# Patient Record
Sex: Female | Born: 1980 | Race: Black or African American | Hispanic: No | Marital: Married | Smoking: Never smoker
Health system: Southern US, Community
[De-identification: ages and names within clinical notes are randomized; demographics above are authoritative.]

## PROBLEM LIST (undated history)

## (undated) DIAGNOSIS — D649 Anemia, unspecified: Secondary | ICD-10-CM

## (undated) DIAGNOSIS — F419 Anxiety disorder, unspecified: Secondary | ICD-10-CM

## (undated) DIAGNOSIS — D219 Benign neoplasm of connective and other soft tissue, unspecified: Secondary | ICD-10-CM

## (undated) DIAGNOSIS — R519 Headache, unspecified: Secondary | ICD-10-CM

## (undated) HISTORY — DX: Headache, unspecified: R51.9

## (undated) HISTORY — DX: Benign neoplasm of connective and other soft tissue, unspecified: D21.9

## (undated) HISTORY — DX: Anxiety disorder, unspecified: F41.9

## (undated) HISTORY — DX: Anemia, unspecified: D64.9

---

## 2004-11-13 ENCOUNTER — Emergency Department (HOSPITAL_COMMUNITY): Admission: EM | Admit: 2004-11-13 | Discharge: 2004-11-13 | Payer: Self-pay

## 2005-01-25 ENCOUNTER — Emergency Department (HOSPITAL_COMMUNITY): Admission: EM | Admit: 2005-01-25 | Discharge: 2005-01-25 | Payer: Self-pay | Admitting: Emergency Medicine

## 2005-03-05 ENCOUNTER — Emergency Department: Payer: Self-pay | Admitting: Emergency Medicine

## 2005-03-06 ENCOUNTER — Emergency Department: Payer: Self-pay | Admitting: Emergency Medicine

## 2005-10-21 ENCOUNTER — Emergency Department: Payer: Self-pay | Admitting: General Practice

## 2007-04-30 ENCOUNTER — Emergency Department: Payer: Self-pay | Admitting: Emergency Medicine

## 2008-03-13 ENCOUNTER — Emergency Department: Payer: Self-pay | Admitting: Emergency Medicine

## 2008-08-22 ENCOUNTER — Observation Stay: Payer: Self-pay | Admitting: Unknown Physician Specialty

## 2008-08-26 ENCOUNTER — Encounter: Payer: Self-pay | Admitting: Maternal & Fetal Medicine

## 2008-09-30 ENCOUNTER — Encounter: Payer: Self-pay | Admitting: Maternal & Fetal Medicine

## 2008-10-04 ENCOUNTER — Observation Stay: Payer: Self-pay | Admitting: Obstetrics and Gynecology

## 2008-10-18 ENCOUNTER — Inpatient Hospital Stay: Payer: Self-pay

## 2008-12-10 IMAGING — US US OB < 14 WEEKS - US OB TV
1 series · 17 of 28 positions shown · non-contrast
Comparison: none

REASON FOR EXAM: Pelvic pain, pregnant, history of  bilateral tubal
ligation
COMMENTS:

[Series 1: us ob < 14 weeks - us ob tv · 17 of 30 slices shown]
[im 1/30]
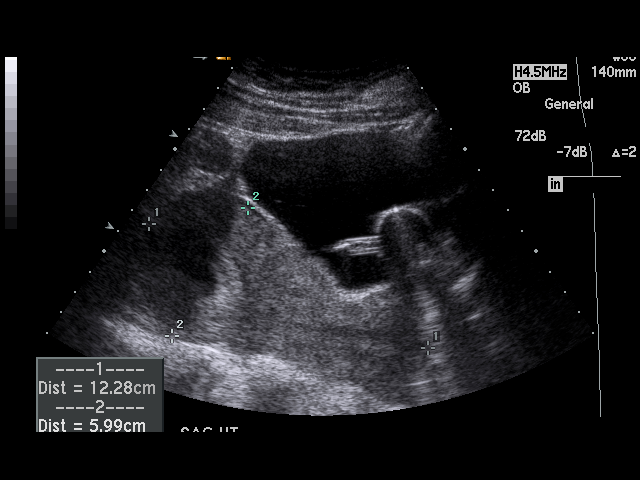
[im 3/30]
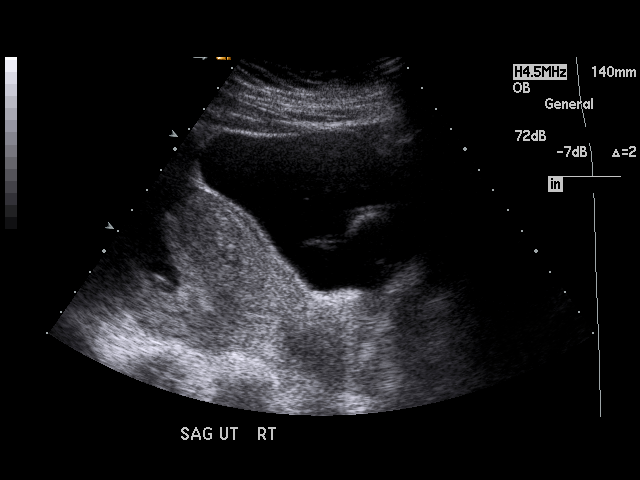
[im 5/30]
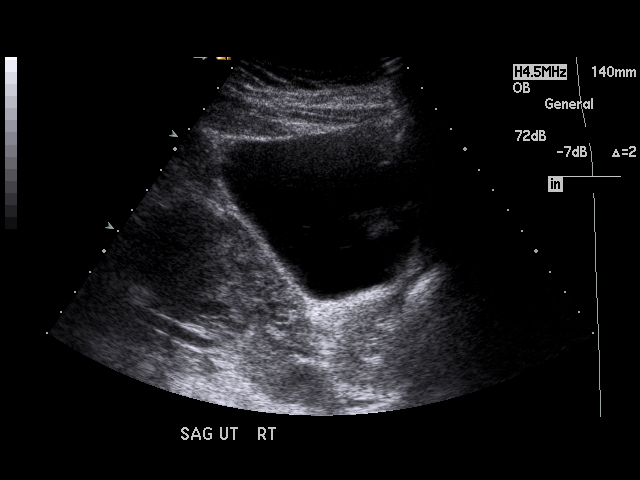
[im 6/30]
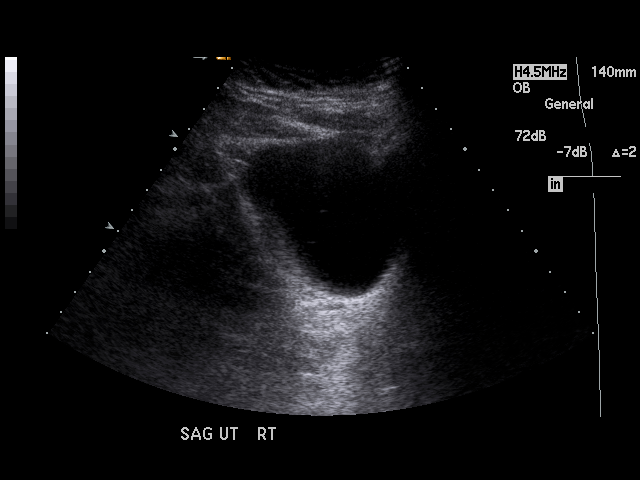
[im 8/30]
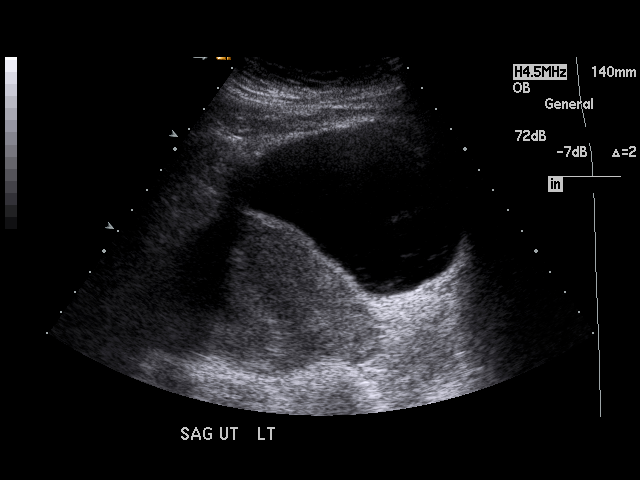
[im 10/30]
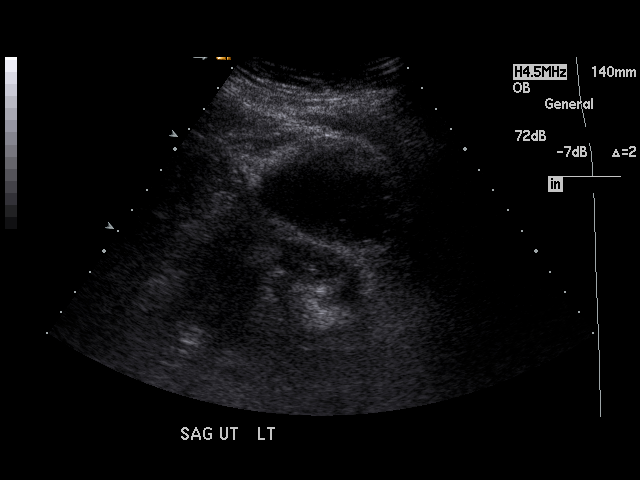
[im 11/30]
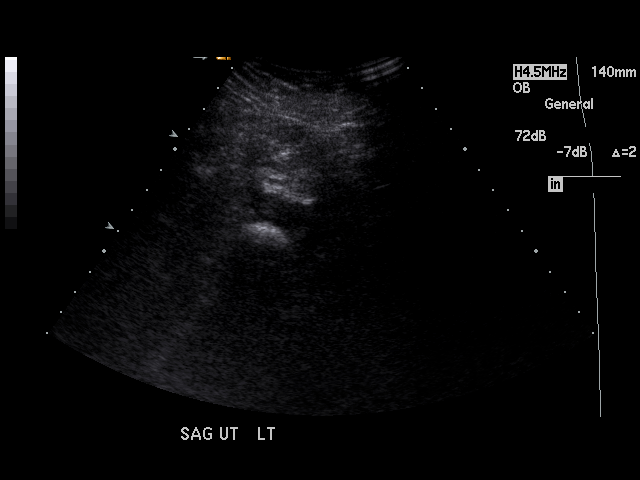
[im 13/30]
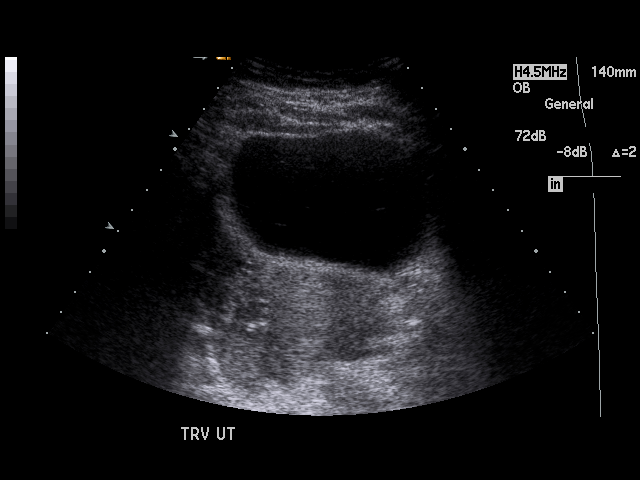
[im 16/30]
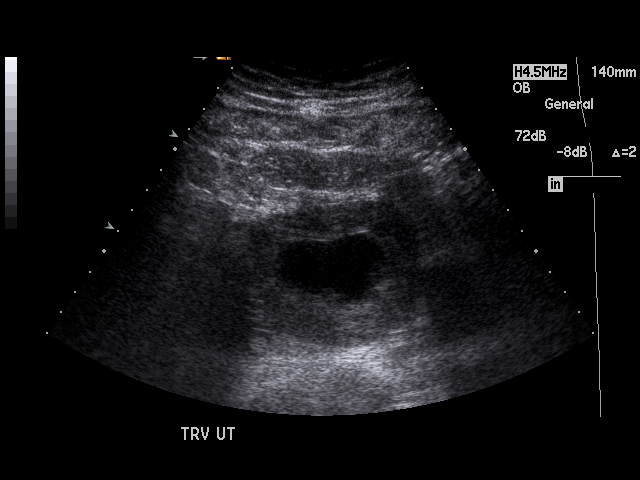
[im 17/30]
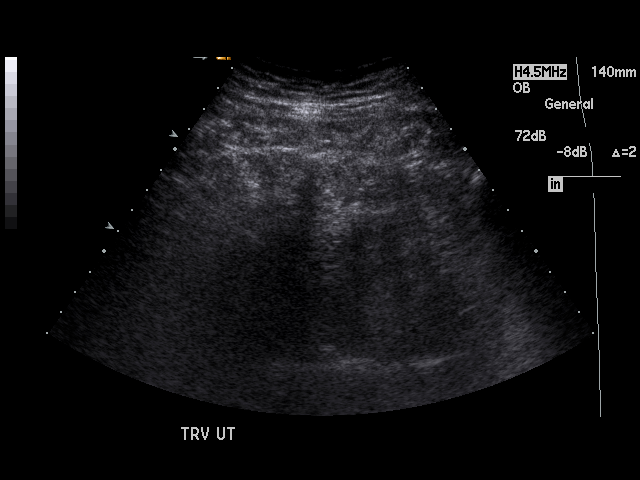
[im 19/30]
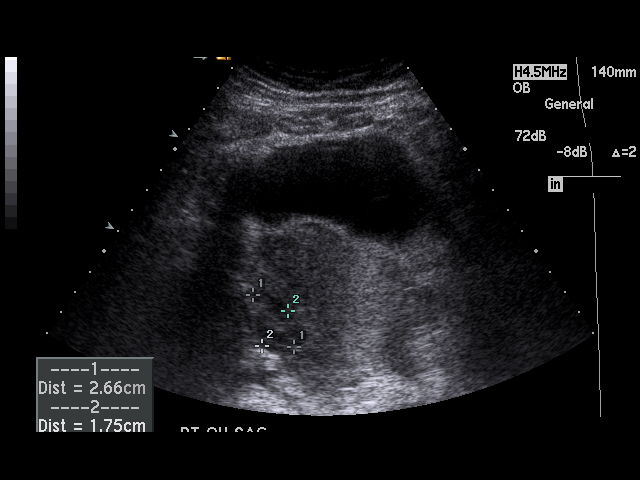
[im 20/30]
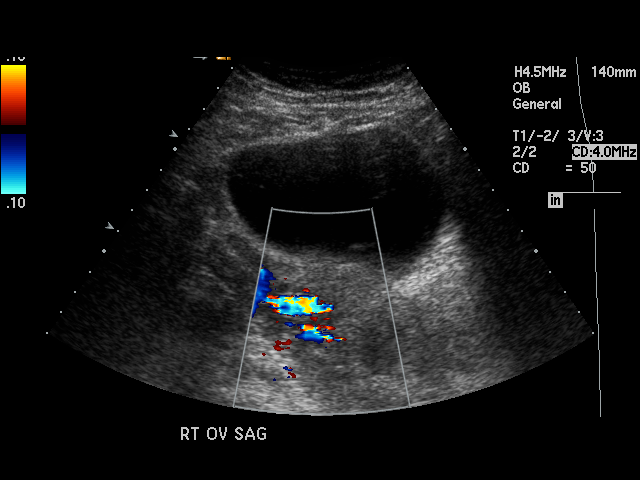
[im 22/30]
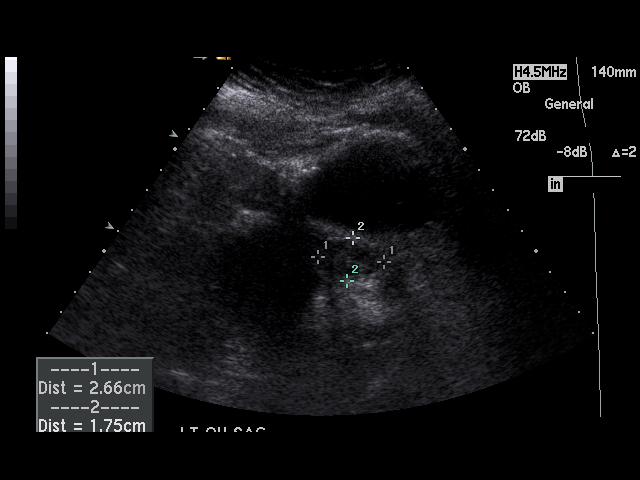
[im 24/30]
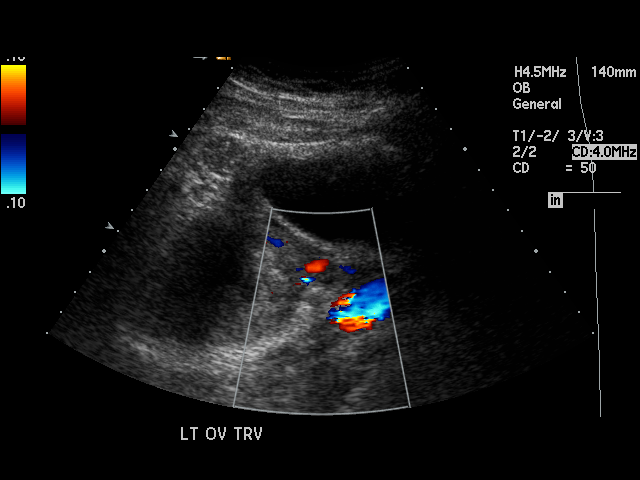
[im 25/30]
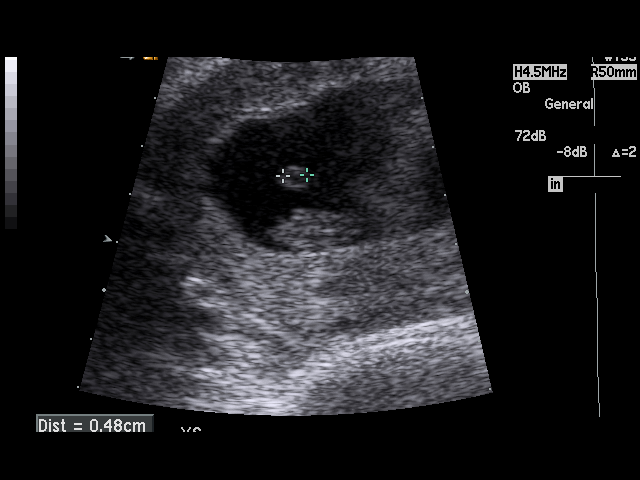
[im 27/30]
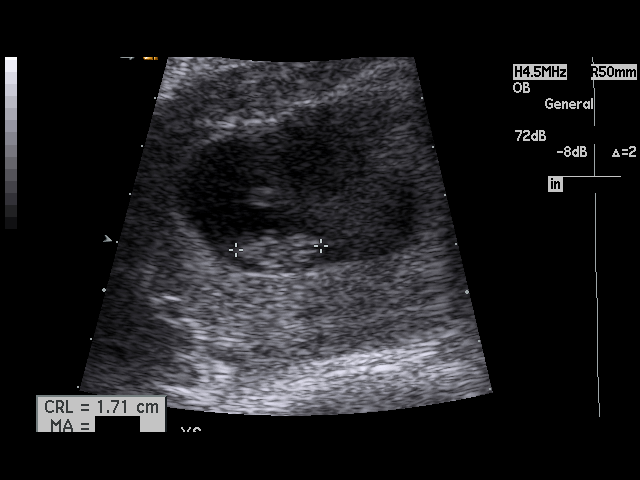
[im 30/30]
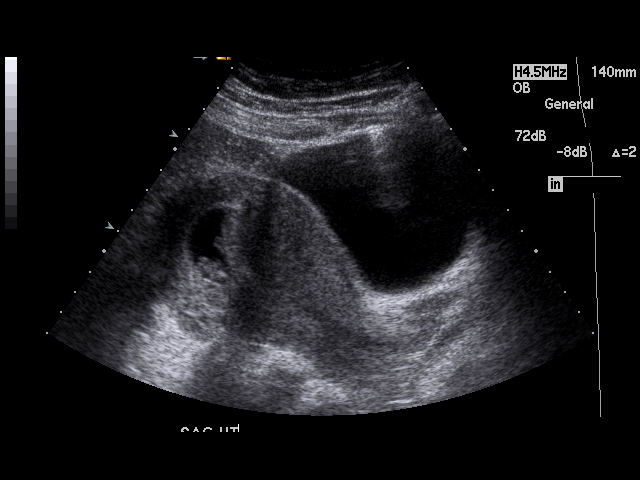

[17 of 28 positions shown; findings below may reference images not displayed]

PROCEDURE:     US  - US OB LESS THAN 14 WEEKS  - March 13, 2008  [DATE]

RESULT:     Emergent sonographic evaluation of the pelvis is performed
utilizing an early OB protocol. The study demonstrates what appears to be an
intrauterine gestational sac. There is a fetal pole with a crown-rump length
of 1.71 cm,  corresponding to an 8-week, one-day intrauterine gestation with
an estimated delivery date of 10/22/2008 sonographically. A fetal heart rate
of 160 beats per minute is present. The ovaries appear to be grossly normal.
There is no free fluid or adnexal mass. There is urine in the bladder.
IMPRESSION: Early viable intrauterine gestation as described.

## 2009-07-03 ENCOUNTER — Emergency Department (HOSPITAL_COMMUNITY): Admission: EM | Admit: 2009-07-03 | Discharge: 2009-07-03 | Payer: Self-pay | Admitting: Family Medicine

## 2013-07-28 ENCOUNTER — Emergency Department (HOSPITAL_COMMUNITY)
Admission: EM | Admit: 2013-07-28 | Discharge: 2013-07-28 | Disposition: A | Discharge status: Home / Self Care | Payer: Self-pay | Attending: Emergency Medicine | Admitting: Emergency Medicine

## 2013-07-28 ENCOUNTER — Encounter (HOSPITAL_COMMUNITY): Payer: Self-pay | Admitting: *Deleted

## 2013-07-28 DIAGNOSIS — Z113 Encounter for screening for infections with a predominantly sexual mode of transmission: Secondary | ICD-10-CM | POA: Insufficient documentation

## 2013-07-28 DIAGNOSIS — Z8619 Personal history of other infectious and parasitic diseases: Secondary | ICD-10-CM | POA: Insufficient documentation

## 2013-07-28 DIAGNOSIS — Z202 Contact with and (suspected) exposure to infections with a predominantly sexual mode of transmission: Secondary | ICD-10-CM

## 2013-07-28 DIAGNOSIS — R109 Unspecified abdominal pain: Secondary | ICD-10-CM | POA: Insufficient documentation

## 2013-07-28 DIAGNOSIS — N898 Other specified noninflammatory disorders of vagina: Secondary | ICD-10-CM | POA: Insufficient documentation

## 2013-07-28 LAB — URINALYSIS, ROUTINE W REFLEX MICROSCOPIC
Hgb urine dipstick: NEGATIVE
Ketones, ur: 15 mg/dL — AB
Nitrite: NEGATIVE
Protein, ur: NEGATIVE mg/dL
Specific Gravity, Urine: 1.034 — ABNORMAL HIGH (ref 1.005–1.030)
Urobilinogen, UA: 2 mg/dL — ABNORMAL HIGH (ref 0.0–1.0)

## 2013-07-28 LAB — WET PREP, GENITAL
Trich, Wet Prep: NONE SEEN
Yeast Wet Prep HPF POC: NONE SEEN

## 2013-07-28 MED ORDER — AZITHROMYCIN 1 G PO PACK
1.0000 g | PACK | Freq: Once | ORAL | Status: AC
  Administered 2013-07-28: 1 g via ORAL
  Filled 2013-07-28: qty 1

## 2013-07-28 MED ORDER — LIDOCAINE HCL (PF) 1 % IJ SOLN
INTRAMUSCULAR | Status: AC
  Administered 2013-07-28: 2.3 mL
  Filled 2013-07-28: qty 5

## 2013-07-28 MED ORDER — CEFTRIAXONE SODIUM 250 MG IJ SOLR
250.0000 mg | Freq: Once | INTRAMUSCULAR | Status: AC
  Administered 2013-07-28: 250 mg via INTRAMUSCULAR
  Filled 2013-07-28: qty 250

## 2013-07-28 NOTE — ED Notes (Signed)
Pt in stating her boyfriend has been c/o penile discharge so she is here for an STD check, c/o mild abdominal cramping over the last week, denies vaginal discharge

## 2013-07-28 NOTE — ED Provider Notes (Signed)
CSN: [id number]     Arrival date & time 07/28/13  2048 History     First MD Initiated Contact with Patient 07/28/13 2153     Chief Complaint  Patient presents with  . SEXUALLY TRANSMITTED DISEASE   (Consider location/radiation/quality/duration/timing/severity/associated sxs/prior Treatment) HPI Comments: Pt's Pt in stating her boyfriend has been c/o penile discharge so she is here for an STD check, c/o mild abdominal cramping over the last week, denies vaginal discharge, dysuria, fever, n/v, d/a.  Ab pain better after BM. Hx of chlamydia.  No prior hx of TOA, PID       Patient is a 32 y.o. female presenting with female genitourinary complaint. The history is provided by the patient. No language interpreter was used.  Female GU Problem This is a new problem. The current episode started 2 days ago. The problem occurs constantly. The problem has not changed since onset.Associated symptoms include abdominal pain. Pertinent negatives include no chest pain, no headaches and no shortness of breath. Nothing aggravates the symptoms. Relieved by: BM. She has tried nothing for the symptoms. The treatment provided no relief.    History reviewed. No pertinent past medical history. No past surgical history on file. No family history on file. History  Substance Use Topics  . Smoking status: Not on file  . Smokeless tobacco: Not on file  . Alcohol Use: Not on file   OB History   Grav Para Term Preterm Abortions TAB SAB Ect Mult Living                 Review of Systems  Constitutional: Negative for fever, chills, diaphoresis, activity change, appetite change and fatigue.  HENT: Negative for congestion, sore throat, facial swelling, rhinorrhea, neck pain and neck stiffness.   Eyes: Negative for photophobia and discharge.  Respiratory: Negative for cough, chest tightness and shortness of breath.   Cardiovascular: Negative for chest pain, palpitations and leg swelling.  Gastrointestinal: Positive  for abdominal pain. Negative for nausea, vomiting and diarrhea.  Endocrine: Negative for polydipsia and polyuria.  Genitourinary: Negative for dysuria, frequency, difficulty urinating and pelvic pain.  Musculoskeletal: Negative for back pain and arthralgias.  Skin: Negative for color change and wound.  Allergic/Immunologic: Negative for immunocompromised state.  Neurological: Negative for facial asymmetry, weakness, numbness and headaches.  Hematological: Does not bruise/bleed easily.  Psychiatric/Behavioral: Negative for confusion and agitation.    Allergies  Review of patient's allergies indicates no known allergies.  Home Medications  No current outpatient prescriptions on file. BP 136/73  Pulse 96  Temp(Src) 98.1 F (36.7 C) (Oral)  Resp 16  SpO2 98% Physical Exam  Constitutional: She is oriented to person, place, and time. She appears well-developed and well-nourished. No distress.  HENT:  Head: Normocephalic and atraumatic.  Mouth/Throat: No oropharyngeal exudate.  Eyes: Pupils are equal, round, and reactive to light.  Neck: Normal range of motion. Neck supple.  Cardiovascular: Normal rate, regular rhythm and normal heart sounds.  Exam reveals no gallop and no friction rub.   No murmur heard. Pulmonary/Chest: Effort normal and breath sounds normal. No respiratory distress. She has no wheezes. She has no rales.  Abdominal: Soft. Bowel sounds are normal. She exhibits no distension and no mass. There is no tenderness. There is no rebound and no guarding.  Genitourinary: Uterus normal. Cervix exhibits discharge (scant white). Cervix exhibits no motion tenderness and no friability. Right adnexum displays no mass, no tenderness and no fullness. Left adnexum displays no mass, no tenderness and no  fullness. No tenderness or bleeding around the vagina. Vaginal discharge (scant) found.  Musculoskeletal: Normal range of motion. She exhibits no edema and no tenderness.  Neurological: She  is alert and oriented to person, place, and time.  Skin: Skin is warm and dry.  Psychiatric: She has a normal mood and affect.    ED Course   Procedures (including critical care time)  Labs Reviewed  WET PREP, GENITAL - Abnormal; Notable for the following:    Clue Cells Wet Prep HPF POC FEW (*)    WBC, Wet Prep HPF POC FEW (*)    All other components within normal limits  URINALYSIS, ROUTINE W REFLEX MICROSCOPIC - Abnormal; Notable for the following:    APPearance CLOUDY (*)    Specific Gravity, Urine 1.034 (*)    Bilirubin Urine SMALL (*)    Ketones, ur 15 (*)    Urobilinogen, UA 2.0 (*)    Leukocytes, UA MODERATE (*)    All other components within normal limits  URINE MICROSCOPIC-ADD ON - Abnormal; Notable for the following:    Squamous Epithelial / LPF MANY (*)    Bacteria, UA MANY (*)    All other components within normal limits  GC/CHLAMYDIA PROBE AMP  URINE CULTURE   No results found. 1. Possible exposure to STD     MDM  Pt is a 32 y.o. female with Pmhx as above who presents with several days of crampy abdominal pain, and boyfriend with penile d/c.  Denies vagina d/c, dysuria, fever.  VSS, pt in NAD, well appearing on exam.  Abdominal exam benign and pt having no pain currently.  Pelvic exam also benign.  Urine contaminated. Wet prep w/ few clue cells, but pt not symptomatic for BV. Will treat presumptively for GC/Chlam. Return precautions given for new or worsening symptoms  1. Possible exposure to STD       Shanna Cisco, MD 07/29/13 1133

## 2013-07-28 NOTE — ED Notes (Signed)
2305  Received report from off going RN and introduced self to the pt.  Pt status is ok at this time but will continue to monitor the pt 

## 2013-07-28 NOTE — ED Notes (Signed)
C/o lower abdominal pain and white discharge. Rates pain as 5/10 on pain scale.

## 2013-07-30 LAB — GC/CHLAMYDIA PROBE AMP
CT Probe RNA: POSITIVE — AB
GC Probe RNA: NEGATIVE

## 2013-07-30 LAB — URINE CULTURE: Colony Count: >=100000

## 2013-07-31 NOTE — ED Notes (Signed)
+   Chlamydia Patient treated with Rocephin and Zithromax-[facility] letter faxed 

## 2013-08-01 NOTE — ED Notes (Signed)
Patient informed of positive results after id'd x 2 and informed of need to notify partner to be treated. 

## 2014-01-20 ENCOUNTER — Emergency Department (HOSPITAL_COMMUNITY)
Admission: EM | Admit: 2014-01-20 | Discharge: 2014-01-20 | Disposition: A | Discharge status: Home / Self Care | Payer: Self-pay | Attending: Emergency Medicine | Admitting: Emergency Medicine

## 2014-01-20 ENCOUNTER — Encounter (HOSPITAL_COMMUNITY): Payer: Self-pay | Admitting: Emergency Medicine

## 2014-01-20 DIAGNOSIS — L02411 Cutaneous abscess of right axilla: Secondary | ICD-10-CM

## 2014-01-20 DIAGNOSIS — F17200 Nicotine dependence, unspecified, uncomplicated: Secondary | ICD-10-CM | POA: Insufficient documentation

## 2014-01-20 DIAGNOSIS — IMO0002 Reserved for concepts with insufficient information to code with codable children: Secondary | ICD-10-CM | POA: Insufficient documentation

## 2014-01-20 MED ORDER — CEPHALEXIN 500 MG PO CAPS: 500.0000 mg | ORAL_CAPSULE | Freq: Four times a day (QID) | ORAL | Status: DC

## 2014-01-20 MED ORDER — SULFAMETHOXAZOLE-TRIMETHOPRIM 800-160 MG PO TABS: 1.0000 | ORAL_TABLET | Freq: Two times a day (BID) | ORAL | Status: AC

## 2014-01-20 MED ORDER — OXYCODONE-ACETAMINOPHEN 5-325 MG PO TABS: 1.0000 | ORAL_TABLET | Freq: Three times a day (TID) | ORAL | Status: DC | PRN

## 2014-01-20 NOTE — ED Provider Notes (Signed)
CSN: [id number]     Arrival date & time 01/20/14  1032 History   First MD Initiated Contact with Patient 01/20/14 1104     Chief Complaint  Patient presents with  . Recurrent Skin Infections     (Consider location/radiation/quality/duration/timing/severity/associated sxs/prior Treatment) The history is provided by the patient. No language interpreter was used.  Stacy Gray is a 33 year old female with no known significant past medical history presenting to emergency department with an abscess to the right axilla that started approximately one week ago. Patient reported that the abscess has gotten larger over time. Reported that there is a soreness, pulling sensation with motion to the arm. Stated that she's been using fat back meat and warm compressions with minimal relief. Denied active drainage or bleeding. Denied fever, chills, numbness, tingling, neck pain, neck stiffness, weakness, headache, dizziness, chest pain, shortness of breath, difficulty breathing, difficulty swallowing. PCP Dr. Kennon Holter  History reviewed. No pertinent past medical history. History reviewed. No pertinent past surgical history. No family history on file. History  Substance Use Topics  . Smoking status: Current Every Day Smoker    Types: Cigarettes  . Smokeless tobacco: Not on file  . Alcohol Use: No   OB History   Grav Para Term Preterm Abortions TAB SAB Ect Mult Living                 Review of Systems  Constitutional: Negative for fever and chills.  HENT: Negative for trouble swallowing.   Respiratory: Negative for chest tightness and shortness of breath.   Cardiovascular: Negative for chest pain.  Musculoskeletal: Negative for neck pain and neck stiffness.  Skin: Positive for wound (Abscess to right axilla).  Neurological: Negative for dizziness and weakness.  All other systems reviewed and are negative.      Allergies  Review of patient's allergies indicates no known allergies.  Home  Medications   Current Outpatient Rx  Name  Route  Sig  Dispense  Refill  . cephALEXin (KEFLEX) 500 MG capsule   Oral   Take 1 capsule (500 mg total) by mouth 4 (four) times daily.   56 capsule   0   . oxyCODONE-acetaminophen (PERCOCET/ROXICET) 5-325 MG per tablet   Oral   Take 1 tablet by mouth every 8 (eight) hours as needed for severe pain.   7 tablet   0   . sulfamethoxazole-trimethoprim (BACTRIM DS,SEPTRA DS) 800-160 MG per tablet   Oral   Take 1 tablet by mouth 2 (two) times daily.   28 tablet   0    BP 133/73  Pulse 79  Temp(Src) 99 F (37.2 C) (Oral)  Resp 20  SpO2 99%  LMP 01/20/2014 Physical Exam  Nursing note and vitals reviewed. Constitutional: She is oriented to person, place, and time. She appears well-developed and well-nourished. No distress.  HENT:  Head: Normocephalic and atraumatic.  Mouth/Throat: Oropharynx is clear and moist. No oropharyngeal exudate.  Eyes: Conjunctivae and EOM are normal. Pupils are equal, round, and reactive to light. Right eye exhibits no discharge. Left eye exhibits no discharge.  Neck: Normal range of motion. Neck supple. No tracheal deviation present.  Negative neck stiffness Negative rigidity Negative cervical lymphadenopathy  Cardiovascular: Normal rate, regular rhythm and normal heart sounds.  Exam reveals no friction rub.   No murmur heard. Pulses:      Radial pulses are 2+ on the right side, and 2+ on the left side.  Pulmonary/Chest: Effort normal and breath sounds normal. No respiratory  distress. She has no wheezes. She has no rales.  Musculoskeletal: Normal range of motion.  Full ROM to upper and lower extremities without difficulty noted, negative ataxia noted.  Lymphadenopathy:    She has no cervical adenopathy.  Neurological: She is alert and oriented to person, place, and time. No cranial nerve deficit. She exhibits normal muscle tone. Coordination normal.  Cranial nerves III-XII grossly intact Strength 5+/5+  to upper extremities bilaterally with resistance applied, equal distribution noted Sensation intact  Skin: Skin is warm. She is not diaphoretic.  Approximately 2 cm x 2 cm fluctuant abscess localized to the right axilla. Negative surrounding erythema or inflammation noted, negative swelling. Negative red streaks. Negative cellulitic findings.  Psychiatric: She has a normal mood and affect. Her behavior is normal. Thought content normal.    ED Course  Procedures (including critical care time)  INCISION AND DRAINAGE Performed by: Jamse Mead Consent: Verbal consent obtained. Risks and benefits: risks, benefits and alternatives were discussed Type: abscess Body area: right axilla Anesthesia: local infiltration Incision was made with a scalpel. Local anesthetic: lidocaine 2% without epinephrine Anesthetic total: 5 ml Complexity: complex Blunt dissection to break up loculations Drainage: purulent Drainage amount: 10-15 cc Packing material: 1/4 in iodoform gauze Patient tolerance: Patient tolerated the procedure well with no immediate complications.    Labs Review Labs Reviewed  WOUND CULTURE   Imaging Review No results found.  EKG Interpretation   None       MDM   Final diagnoses:  Abscess of axilla, right    Filed Vitals:   01/20/14 1057  BP: 133/73  Pulse: 79  Temp: 99 F (37.2 C)  TempSrc: Oral  Resp: 20  SpO2: 99%   Patient presenting to emergency department with abscess localized the right axilla does been ongoing for one week. Patient reports she's history of acid season normally occur within her right axilla-recurrence infection. Reported that the abscess is gotten larger described the pain as is sore, pulling sensation with motion to the right upper extremity. Reported that she's been applying warm compressions and fat back meat with minimal relief. Alert and oriented. GCS 15. Heart rate and rhythm normal. Lungs clear to auscultation. Radial pulses  2+ bilaterally. Full range of motion noted to the right upper extremity without difficulty or ataxia. Strength intact with equal distribution to equal grip strength. Sensation intact. Approximately 2 cm x 2 cm fluctuant abscess with discomfort upon palpation-negative surrounding swelling, inflammation, red streaks. Negative cellulitic findings. Incision and drainage performed with pus drainage, approximately 10-15 cc. Patient locally anesthetized. Site thoroughly irrigated, hemostat used to break down loculations. Iodoform packing placed. Wound covered. Patient tolerated procedure well. Wound culture obtained-results pending. Patient stable, afebrile. Discharged patient. Discharge patient with pain medications, antibiotics-discussed course, cautions, disposal technique. Referred patient to primary care provider and general surgery since this is a recurrent issue-possible sinus tract. Discussed the patient to apply warm compressions. Discussed with patient proper wound care. Discussed with patient that packing needs to be removed within 2-3 days. Discussed with patient to closely monitor symptoms and if symptoms are to worsen or change to report back to the ED - strict return instructions given.  Patient agreed to plan of care, understood, all questions answered.   Medical screening examination/treatment/procedure(s) were performed by non-physician practitioner and as supervising physician I was immediately available for consultation/collaboration.  EKG Interpretation   None        Jamse Mead, PA-C 01/21/14 [place], MD 01/22/14 1446

## 2014-01-20 NOTE — ED Notes (Signed)
Pt from home c/o right underarm abcess that has been there for 1 week. No drainage but if red and swollen.

## 2014-01-20 NOTE — Discharge Instructions (Signed)
Please call your doctor for a followup appointment within 24-48 hours. When you talk to your doctor please let them know that you were seen in the emergency department and have them acquire all of your records so that they can discuss the findings with you and formulate a treatment plan to fully care for your new and ongoing problems. Please take antibiotics as prescribed-Keflex and Bactrim-please take on a full stomach While on pain medications-Percocet there is to be no drinking alcohol, driving, operating any heavy machinery. If there is extra please disposer proper manner. Please do not take any extra Tylenol for this can lead to Tylenol overdose and liver issues. Please keep gauze dry - if they get wet please change to dry ones Please apply warm compressions Please remove packing within 2-3 days Please continue to monitor symptoms closely and if symptoms are to worsen or change (fever greater than 101, neck pain, neck stiffness, swelling to the arm, numbness, tingling, red streaks, worsening pain, fall, injury, chest pain, shortness of breath, difficulty breathing) please report back to emergency department immediately   Abscess An abscess is an infected area that contains a collection of pus and debris.It can occur in almost any part of the body. An abscess is also known as a furuncle or boil. CAUSES  An abscess occurs when tissue gets infected. This can occur from blockage of oil or sweat glands, infection of hair follicles, or a minor injury to the skin. As the body tries to fight the infection, pus collects in the area and creates pressure under the skin. This pressure causes pain. People with weakened immune systems have difficulty fighting infections and get certain abscesses more often.  SYMPTOMS Usually an abscess develops on the skin and becomes a painful mass that is red, warm, and tender. If the abscess forms under the skin, you may feel a moveable soft area under the skin. Some  abscesses break open (rupture) on their own, but most will continue to get worse without care. The infection can spread deeper into the body and eventually into the bloodstream, causing you to feel ill.  DIAGNOSIS  Your caregiver will take your medical history and perform a physical exam. A sample of fluid may also be taken from the abscess to determine what is causing your infection. TREATMENT  Your caregiver may prescribe antibiotic medicines to fight the infection. However, taking antibiotics alone usually does not cure an abscess. Your caregiver may need to make a small cut (incision) in the abscess to drain the pus. In some cases, gauze is packed into the abscess to reduce pain and to continue draining the area. HOME CARE INSTRUCTIONS   Only take over-the-counter or prescription medicines for pain, discomfort, or fever as directed by your caregiver.  If you were prescribed antibiotics, take them as directed. Finish them even if you start to feel better.  If gauze is used, follow your caregiver's directions for changing the gauze.  To avoid spreading the infection:  Keep your draining abscess covered with a bandage.  Wash your hands well.  Do not share personal care items, towels, or whirlpools with others.  Avoid skin contact with others.  Keep your skin and clothes clean around the abscess.  Keep all follow-up appointments as directed by your caregiver. SEEK MEDICAL CARE IF:   You have increased pain, swelling, redness, fluid drainage, or bleeding.  You have muscle aches, chills, or a general ill feeling.  You have a fever. MAKE SURE YOU:  Understand these instructions.  Will watch your condition.  Will get help right away if you are not doing well or get worse. Document Released: 09/01/2005 Document Revised: 05/23/2012 Document Reviewed: 02/04/2012 [facility] Patient Information 2014 [place].  Abscess Care After An abscess (also called a boil or furuncle) is  an infected area that contains a collection of pus. Signs and symptoms of an abscess include pain, tenderness, redness, or hardness, or you may feel a moveable soft area under your skin. An abscess can occur anywhere in the body. The infection may spread to surrounding tissues causing cellulitis. A cut (incision) by the surgeon was made over your abscess and the pus was drained out. Gauze may have been packed into the space to provide a drain that will allow the cavity to heal from the inside outwards. The boil may be painful for 5 to 7 days. Most people with a boil do not have high fevers. Your abscess, if seen early, may not have localized, and may not have been lanced. If not, another appointment may be required for this if it does not get better on its own or with medications. HOME CARE INSTRUCTIONS   Only take over-the-counter or prescription medicines for pain, discomfort, or fever as directed by your caregiver.  When you bathe, soak and then remove gauze or iodoform packs at least daily or as directed by your caregiver. You may then wash the wound gently with mild soapy water. Repack with gauze or do as your caregiver directs. SEEK IMMEDIATE MEDICAL CARE IF:   You develop increased pain, swelling, redness, drainage, or bleeding in the wound site.  You develop signs of generalized infection including muscle aches, chills, fever, or a general ill feeling.  An oral temperature above 102 F (38.9 C) develops, not controlled by medication. See your caregiver for a recheck if you develop any of the symptoms described above. If medications (antibiotics) were prescribed, take them as directed. Document Released: 06/10/2005 Document Revised: 02/14/2012 Document Reviewed: 02/05/2008 [facility] Patient Information 2014 [place].   Emergency Department Resource Guide 1) Find a Doctor and Pay Out of Pocket Although you won't have to find out who is covered by your insurance plan, it is a good  idea to ask around and get recommendations. You will then need to call the office and see if the doctor you have chosen will accept you as a new patient and what types of options they offer for patients who are self-pay. Some doctors offer discounts or will set up payment plans for their patients who do not have insurance, but you will need to ask so you aren't surprised when you get to your appointment.  2) Contact Your Local Health Department Not all health departments have doctors that can see patients for sick visits, but many do, so it is worth a call to see if yours does. If you don't know where your local health department is, you can check in your phone book. The CDC also has a tool to help you locate your state's health department, and many state websites also have listings of all of their local health departments.  3) Find a [facility] If your illness is not likely to be very severe or complicated, you may want to try a walk in clinic. These are popping up all over the country in pharmacies, drugstores, and shopping centers. They're usually staffed by nurse practitioners or physician assistants that have been trained to treat common illnesses and complaints. They're usually  fairly quick and inexpensive. However, if you have serious medical issues or chronic medical problems, these are probably not your best option.  No Primary Care Doctor: - Call Health Connect at  [phone] - they can help you locate a primary care doctor that  accepts your insurance, provides certain services, etc. - Physician Referral Service- [phone]  Chronic Pain Problems: Organization         Address  Phone   Notes  [facility]  [phone] Patients need to be referred by their primary care doctor.   Medication Assistance: Organization         Address  Phone   Notes  [facility] Medication [facility] [address] [place], [place] [address] [phone]  --Must be a resident of [facility] -- Must have NO insurance coverage whatsoever (no Medicaid/ Medicare, etc.) -- The pt. MUST have a primary care doctor that directs their care regularly and follows them in the community   [facility]  [phone]   [employer]  [phone]    Agencies that provide inexpensive medical care: Organization         Address  Phone   Notes  [place]  [phone]   [facility]    [phone]   [facility] [place], [place] [address] [phone]   [place] [address] [place] [phone]   Planned Parenthood    [phone]   [facility]    [phone]   [place] and [place] [address], [place] Phone:  [phone], Fax:  [phone] Hours of Operation:  9 am - 6 pm, M-F.  Also accepts Medicaid/Medicare and self-pay.  [facility] for [place] [address], [address], [place] Phone: [phone], Fax: [phone]. Hours of Operation:  8:30 am - 5:30 pm, M-F.  Also accepts Medicaid and self-pay.  [facility] [place] [address], [place] Phone: [phone]   [place], [place], [place] [phone], Ext. [phone] Mondays & Thursdays: 7-9 AM.  First 15 patients are seen on a first come, first serve basis.    [place] Providers:  Organization         Address  Phone   Notes  [facility] [address],  [phone] Also accepts self-pay patients.  [facility] [id number] [place], [place]  [phone]   [place], [address], [place] [phone]   [facility] [address], [place] [phone]   Lucianne Lei [address], [place]   [phone] Only  accepts [place] Access [place] patients after they have their name applied to their card.   Self-Pay (no insurance) in [facility]:  Organization         Address  Phone   Notes  Sickle Cell Patients, [facility] [place] [phone]   [facility] [place] [phone]   [facility] [place]  [place], [address], [place] [phone]   [facility]/Dr. Osei-Bonsu  [address] [place] or [place], [address], [place] [phone] Phone  number for both [place] and [place] locations is the same.  Urgent Medical and [facility] [address] [place] [phone]   [facility] [address], [place] or [address] [phone]   [facility] [address] [place] [phone], phone; [phone], fax Sees patients 1st and 3rd Saturday of every month.  Must not qualify for public or private insurance (i.e. Medicaid, Medicare, [place] Health Choice, Veterans' Benefits)  Household income should be no more than 200% of the poverty level The clinic cannot treat you if you are pregnant or think you are pregnant  Sexually transmitted diseases are not treated at the clinic.    Dental Care: Organization         Address  Phone  Notes  [facility] Department of [facility] [address] [place], [place] [phone] Accepts children up to age 48 who are enrolled in [place] or [place] Health Choice; pregnant women with a Medicaid card; and children who have applied for Medicaid or [place] Health Choice, but were declined, whose parents can pay a reduced fee at time of service.  [facility] Department of [facility]  [address], [place] [phone] Accepts children up to age 36 who are enrolled in [place] or [place] Health Choice; pregnant  women with a Medicaid card; and children who have applied for Medicaid or [place] Health Choice, but were declined, whose parents can pay a reduced fee at time of service.  [facility]  [address] [place], [place] [phone] Patients are seen by appointment only. Walk-ins are not accepted. [facility] will see patients 68 years of age and older. Monday - Tuesday (8am-5pm) Most Wednesdays (8:30-5pm) $30 per visit, cash only  [facility]  [address], [facility] [phone] Patients are seen by appointment only. Walk-ins are not accepted. [facility] will see patients 28 years of age and older. One Wednesday Evening (Monthly: Volunteer Based).  $30 per visit, cash only  [employer]  [phone] for adults; Children under age 42, call Graduate Pediatric Dentistry at [phone]. Children aged 55-14, please call [phone] to request a pediatric application.  Dental services are provided in all areas of dental care including fillings, crowns and bridges, complete and partial dentures, implants, gum treatment, root canals, and extractions. Preventive care is also provided. Treatment is provided to both adults and children. Patients are selected via a lottery and there is often a waiting list.   [facility] [address], [place]  [phone] [email or web address]   [facility] [address] [place], [place] [phone], Ext. [phone] Second and Fourth Thursday of each month, opens at 6:30 AM; Clinic ends at 9 AM.  Patients are seen on a first-come first-served basis, and a limited number are seen during each clinic.   [facility]  [address] Ether [place], [place] [phone]   Eligibility Requirements You must have lived in [place], [place], or [place] counties for at least the last three months.   You cannot be eligible for state or federal sponsored  [place], including [employer], [place], or [employer].   You generally cannot be eligible for healthcare insurance through your employer.    How to apply: Eligibility screenings are held every Tuesday and Wednesday afternoon from 1:00 pm  until 4:00 pm. You do not need an appointment for the interview!  [facility] [address] [place], [place]   [place]  [place] Department  [place]  [phone]    Behavioral Health Resources in the Community: Intensive Outpatient Programs Organization         Address  Phone  Notes  [facility] [place] [address], [place], [place] [phone]   [facility] [address] [place], [place]   ADS: Alcohol & Drug Svcs [address], [place], [place]   [place] [address]  [place], [place] or [phone]   Substance Abuse Resources Organization         Address  Phone  Notes  Alcohol and Drug Services  [phone]   [place]  [phone]   [facility] [place]   Chinita Pester  [phone]   Residential & Outpatient Substance Abuse Program  [phone]   Psychological Services Organization         Address  Phone  Notes  [facility] [place]  [place]  [phone]   [place] [address], [place] or [phone]    Mobile Crisis Teams Organization         Address  Phone  Notes  [facility], Mobile Crisis Care Unit  [phone]   [facility]  [address] [place], [place]   [place] Levels [address], [place] [phone]    Self-Help/Support Groups Organization         Address  Phone              Notes  [place]. of [place] - variety of support groups  [place] Call for more information  Narcotics Anonymous (NA), [facility] [address], [employer] [place]  2 meetings at this location   Special educational needs teacher         Address  Phone  Notes  [facility]idential Treatment [place],    [place]  [phone]   [facility]  [address], [place] [id number], [place], [place]   [place], [place] [phone] Admissions: 8am-3pm M-F  Incentives Substance [place] [address],    [place], [place] [id number]   [facility] [address] [place], [place], [place]   [facility] [address]  [place], [place]   [facility] Programs - Intensive Outpatient [address] Kristeen [address], [place], [place]   [facility] [address].,  [place], [place] [phone] or [phone]   [facility] Services (RTS) [address] [place], [place] Accepts Medicaid  [facility] [place] [address],  [place] [phone] Substance Abuse/Addiction Treatment   [facility] Organization         Address  Phone  Notes  [facility]  [phone]   Domenic Schwab, PhD [address] Arlis Porta [place], [place]   [phone] or [phone]   [place], [place] [phone]   [place] [address], [place], [place] [phone] Insurance/Medicaid/sponsorship through [employer] and Families [address] [place] [phone] [place], [place] [phone]    Dr. Adele Schilder  [phone]   [facility] Dept. 1) [address], [place] 2) [place] 3)  [address], [place] [phone]  [phone]   [place] [phone] or [phone] (After Hours)

## 2014-01-25 LAB — WOUND CULTURE: Special Requests: NORMAL

## 2014-01-27 ENCOUNTER — Telehealth (HOSPITAL_COMMUNITY): Payer: Self-pay | Admitting: Emergency Medicine

## 2014-01-27 NOTE — ED Notes (Signed)
Post ED Visit - Positive Culture Follow-up  Culture report reviewed by antimicrobial stewardship pharmacist: []  Wes [place], Pharm.D., BCPS []  Heide Guile, Pharm.D., BCPS []  Alycia Rossetti, Pharm.D., BCPS [x]  [place], Pharm.D., BCPS, AAHIVP []  Legrand [place], Pharm.D., BCPS, AAHIVP  Positive wound culture Treated with Keflex, organism sensitive to the same and no further patient follow-up is required at this time.  [place], Rex Kras 01/27/2014, 6:09 PM

## 2014-05-09 SURGERY — Surgical Case
Anesthesia: *Unknown

## 2014-12-27 ENCOUNTER — Emergency Department (HOSPITAL_COMMUNITY)
Admission: EM | Admit: 2014-12-27 | Discharge: 2014-12-27 | Disposition: A | Discharge status: Home / Self Care | Payer: BLUE CROSS/BLUE SHIELD | Attending: Emergency Medicine | Admitting: Emergency Medicine

## 2014-12-27 DIAGNOSIS — Z720 Tobacco use: Secondary | ICD-10-CM | POA: Diagnosis not present

## 2014-12-27 DIAGNOSIS — L02411 Cutaneous abscess of right axilla: Secondary | ICD-10-CM

## 2014-12-27 MED ORDER — LIDOCAINE HCL (PF) 1 % IJ SOLN
5.0000 mL | Freq: Once | INTRAMUSCULAR | Status: AC
  Administered 2014-12-27: 5 mL
  Filled 2014-12-27: qty 5

## 2014-12-27 MED ORDER — CEPHALEXIN 500 MG PO CAPS: 500.0000 mg | ORAL_CAPSULE | Freq: Four times a day (QID) | ORAL | Status: DC

## 2014-12-27 MED ORDER — SULFAMETHOXAZOLE-TRIMETHOPRIM 800-160 MG PO TABS: 1.0000 | ORAL_TABLET | Freq: Two times a day (BID) | ORAL | Status: AC

## 2014-12-27 MED ORDER — OXYCODONE-ACETAMINOPHEN 5-325 MG PO TABS: 1.0000 | ORAL_TABLET | Freq: Three times a day (TID) | ORAL | Status: DC | PRN

## 2014-12-27 NOTE — Discharge Instructions (Signed)
Please call your doctor for a followup appointment within 24-48 hours. When you talk to your doctor please let them know that you were seen in the emergency department and have them acquire all of your records so that they can discuss the findings with you and formulate a treatment plan to fully care for your new and ongoing problems. Please follow-up with the emergency department within approximately 48 hours for site to be reassessed Please take antibiotics as prescribed and on a full stomach Please apply warm compressions at least 3-4 times per day massage to aid in releasing the drainage Please take medications as prescribed - while on pain medications there is to be no drinking alcohol, driving, operating any heavy machinery. If extra please dispose in a proper manner. Please do not take any extra Tylenol with this medication for this can lead to Tylenol overdose and liver issues.  Please continue to monitor symptoms closely and if symptoms are to worsen or change (fever greater than 101, chills, sweating, nausea, vomiting, chest pain, shortness of breathe, difficulty breathing, weakness, numbness, tingling, worsening or changes to pain pattern, increased swelling, redness, red streaks running down the arm, neck pain) please report back to the Emergency Department immediately.    Abscess Care After An abscess (also called a boil or furuncle) is an infected area that contains a collection of pus. Signs and symptoms of an abscess include pain, tenderness, redness, or hardness, or you may feel a moveable soft area under your skin. An abscess can occur anywhere in the body. The infection may spread to surrounding tissues causing cellulitis. A cut (incision) by the surgeon was made over your abscess and the pus was drained out. Gauze may have been packed into the space to provide a drain that will allow the cavity to heal from the inside outwards. The boil may be painful for 5 to 7 days. Most people with a  boil do not have high fevers. Your abscess, if seen early, may not have localized, and may not have been lanced. If not, another appointment may be required for this if it does not get better on its own or with medications. HOME CARE INSTRUCTIONS   Only take over-the-counter or prescription medicines for pain, discomfort, or fever as directed by your caregiver.  When you bathe, soak and then remove gauze or iodoform packs at least daily or as directed by your caregiver. You may then wash the wound gently with mild soapy water. Repack with gauze or do as your caregiver directs. SEEK IMMEDIATE MEDICAL CARE IF:   You develop increased pain, swelling, redness, drainage, or bleeding in the wound site.  You develop signs of generalized infection including muscle aches, chills, fever, or a general ill feeling.  An oral temperature above 102 F (38.9 C) develops, not controlled by medication. See your caregiver for a recheck if you develop any of the symptoms described above. If medications (antibiotics) were prescribed, take them as directed. Document Released: 06/10/2005 Document Revised: 02/14/2012 Document Reviewed: 02/05/2008 [facility] Patient Information 2015 [place], [place]. This information is not intended to replace advice given to you by your health care provider. Make sure you discuss any questions you have with your health care provider.   Emergency Department Resource Guide 1) Find a Doctor and Pay Out of Pocket Although you won't have to find out who is covered by your insurance plan, it is a good idea to ask around and get recommendations. You will then need to call the office  and see if the doctor you have chosen will accept you as a new patient and what types of options they offer for patients who are self-pay. Some doctors offer discounts or will set up payment plans for their patients who do not have insurance, but you will need to ask so you aren't surprised when you get to your  appointment.  2) Contact Your Local Health Department Not all health departments have doctors that can see patients for sick visits, but many do, so it is worth a call to see if yours does. If you don't know where your local health department is, you can check in your phone book. The CDC also has a tool to help you locate your state's health department, and many state websites also have listings of all of their local health departments.  3) Find a [facility] Clinic If your illness is not likely to be very severe or complicated, you may want to try a walk in clinic. These are popping up all over the country in pharmacies, drugstores, and shopping centers. They're usually staffed by nurse practitioners or physician assistants that have been trained to treat common illnesses and complaints. They're usually fairly quick and inexpensive. However, if you have serious medical issues or chronic medical problems, these are probably not your best option.  No Primary Care Doctor: - Call Health Connect at  [phone] - they can help you locate a primary care doctor that  accepts your insurance, provides certain services, etc. - Physician Referral Service- [phone]  Chronic Pain Problems: Organization         Address  Phone   Notes  [facility]  [phone] Patients need to be referred by their primary care doctor.   Medication Assistance: Organization         Address  Phone   Notes  [facility] [address] [place], [place] [address] [phone] --Must be a resident of [facility] -- Must have NO insurance coverage whatsoever (no Medicaid/ Medicare, etc.) -- The pt. MUST have a primary care doctor that directs their care regularly and follows them in the community   [facility]  [phone]   [employer]  7144781295    Agencies that provide inexpensive medical care: Organization         Address  Phone   Notes  [place]  [phone]   [facility]    [phone]   Gi [place] Norman [place], [place] [address] [phone]   [place] [address], [place] [phone]   Planned Parenthood    [phone]   [facility]    [phone]   [place] and [place] [address], [place] Phone:  [phone], Fax:  [phone] Hours of Operation:  9 am - 6 pm, M-F.  Also accepts Medicaid/Medicare and self-pay.  [facility] for [place] [address], [address], [place] Phone: [phone], Fax: [phone]. Hours of Operation:  8:30 am - 5:30 pm, M-F.  Also accepts Medicaid and self-pay.  [facility] [place] [address] [place] Phone: [phone]   [place], [place], [place] [phone], Ext. [phone] Mondays & Thursdays: 7-9 AM.  First 15 patients are seen on a first come, first serve basis.    Medicaid-accepting [place] Providers:  Organization         Address  Phone   Notes  [facility] [address], [place] [phone] Also accepts self-pay patients.  [facility] [address], [place]  [phone]   [place], [address], [place] [phone]   [facility] [address], [place] [phone]   Lucianne Lei [address], [place]   [phone] Only accepts [place] Access [place] patients after they have their name applied to their card.   Self-Pay (no insurance) in [facility]:  Organization         Address  Phone   Notes  Sickle Cell Patients, [facility] [place] [phone]   [facility] [place] [phone]   [facility]  [place]  [place], [place], [place] [phone]   [facility]/Dr. Osei-Bonsu  [address] [place] or [address], [place] [phone] Phone number for both [place] and [place] locations is the same.  Urgent Medical and [facility] [address] [place] [phone]   [facility] [address], [place] or [address] [phone]   [facility] [address] [place] [phone], phone; [phone], fax Sees patients 1st and 3rd Saturday of every month.  Must not qualify for public or private insurance (i.e. Medicaid, Medicare, [place] Health Choice, Veterans' Benefits)  Household income should be no more than 200% of the poverty level The clinic cannot treat you if you are pregnant or think you are pregnant  Sexually transmitted diseases are not treated at the clinic.    Dental Care: Organization         Address  Phone  Notes  [facility] [place] [phone] Accepts children up to age 98 who are enrolled in [place] or [place]; pregnant women with a Medicaid card; and children who have applied for Medicaid or [place] Health Choice, but were declined, whose parents can pay a reduced fee at time of service.  [facility] Department of [facility]  [address], [place] [phone] Accepts children up to age 30 who are enrolled in [place] or [place]; pregnant women with a Medicaid card; and children who have applied for Medicaid or [place] Health Choice, but were declined, whose parents can pay a reduced fee at time of service.  [place] Adult Dental Access PROGRAM  [place] [phone] Patients are seen by appointment only. Walk-ins are not accepted. [place] will see patients 98 years of age and  older. Monday - Tuesday (8am-5pm) Most Wednesdays (8:30-5pm) $30 per visit, cash only  [facility] Adult Dental Access PROGRAM  [address], [facility] [phone] Patients are seen by appointment only. Walk-ins are not accepted. [place] will see patients 42 years of age and older. One Wednesday Evening (Monthly: Volunteer Based).  $30 per visit, cash only  [place]  [phone] for adults; Children under age 47, call Graduate Pediatric Dentistry at [phone]. Children aged 59-14, please call [phone] to request a pediatric application.  Dental services are provided in all areas of dental care including fillings, crowns and bridges, complete and partial dentures, implants, gum treatment, root canals, and extractions. Preventive care is also provided. Treatment is provided to both adults and children. Patients are selected via a lottery and there is often a waiting list.   [facility] [address], [place]  [phone] [email or web address]   [facility] [address] [place], [place] [phone], Ext. [phone] Second and Fourth Thursday of each month, opens at 6:30 AM; Clinic ends at 9 AM.  Patients are seen on a first-come first-served basis, and a limited number are seen during each clinic.   [facility]  [address] Hillard Danker [place], [place] [phone]   Eligibility Requirements You must have lived in [place], [place], or [place] counties for at least the last three months.   You cannot be eligible for state or federal sponsored [employer], including [employer], [place], or [employer].   You generally cannot be eligible for healthcare insurance through your employer.    How to apply: Eligibility screenings are held every Tuesday and Wednesday afternoon from 1:00 pm until 4:00 pm. You do not need an appointment for the interview!  [facility] [address],  [place], [place]   Liborio Negron Torres  [place] Department  [place]  [phone]    Behavioral Health Resources in the Community: Intensive Outpatient Programs Organization         Address  Phone  Notes  [place]. [address] [place], [place] [phone]   [facility] Outpatient [address], [place], [place]   ADS: Alcohol & Drug Svcs [address], [place], [place]   [place] [address]  [place], [place] or [phone]   Substance Abuse Resources Organization         Address  Phone  Notes  Alcohol and Drug Services  [phone]   [place]  [phone]   [facility] [place]   Chinita Pester  [phone]   Residential & Outpatient Substance Abuse Program  [phone]   Psychological Services Organization         Address  Phone  Notes  [facility] [place]  [place]  [phone]   [place] [address] [place] or [phone]    Mobile Crisis Teams Organization         Address  Phone  Notes  [facility], Mobile Crisis Care Unit  [phone]   [facility]  [address] [place], [place]   [place] [address], [place] [phone]    Self-Help/Support Groups Organization         Address  Phone             Notes  [place]. of [place] - variety of support groups  [place] Call for more information  Narcotics Anonymous (NA), [facility] [address], [employer] [place]  2 meetings at this location   Special educational needs teacher         Address  Phone  Notes  [facility] [place],    [place]  [place]  [address] [place] [phone], [place], [place]  [place], [place] [phone] Admissions: 8am-3pm M-F  Incentives Substance [place] [address]    [place], [place] [phone]   [facility] [address] [place], [place], [place]   [facility] [address],  [place], [place]   [facility] - Intensive Outpatient [address] Kristeen [address], [place], [place]   [facility] [place] ([place].,  [place], [place] [phone] or [phone]   [facility] (RTS) [address] [place], [place] Accepts Medicaid  [facility] [place] [address]  [place] [phone] Substance Abuse/Addiction Treatment   [facility] Organization         Address  Phone  Notes  [facility]  [phone]   Domenic Schwab, PhD [address] Arlis Porta [place], [place]   [phone] or [phone]   [place], [place] [phone]   [facility] [address], [place], [place] [phone] Insurance/Medicaid/sponsorship through [facility] and Families [address], [address] [place]                                    [place], [place] [phone] [place] [address], [place] [phone]    Dr. Adele Schilder  [phone]   [facility] Dept. 1) [address] [place] 2) [place] 3)  [place] [address], [place] [phone]  [phone]   [place] [phone] or [phone] (After Hours)

## 2014-12-27 NOTE — ED Provider Notes (Addendum)
CSN: [id number]     Arrival date & time 12/27/14  1051 History   First MD Initiated Contact with Patient 12/27/14 1056     Chief Complaint  Patient presents with  . Abscess     (Consider location/radiation/quality/duration/timing/severity/associated sxs/prior Treatment) The history is provided by the patient. No language interpreter was used.  Stacy Gray is a 34 y/o F with PMHx of abscess to the right axilla presenting to the ED with abscess to the right axilla that she noticed 5 days ago. Patient reported that the abscess has gotten larger and stated that it started to drain yesterday. Stated that she has been placing warm compressions at the site. Denied fever, neck pain, neck stiffness, changes to skin colored, red streaks, numbness, tingling, weakness, chest pain, shortness of breath, difficulty breathing. LMP 12/10/2014. PCP none  No past medical history on file. No past surgical history on file. No family history on file. History  Substance Use Topics  . Smoking status: Current Every Day Smoker    Types: Cigarettes  . Smokeless tobacco: Not on file  . Alcohol Use: No   OB History    No data available     Review of Systems  Constitutional: Negative for fever and chills.  Musculoskeletal: Negative for neck pain and neck stiffness.  Skin: Positive for wound (abscess).  Neurological: Negative for weakness and numbness.      Allergies  Review of patient's allergies indicates no known allergies.  Home Medications   Prior to Admission medications   Medication Sig Start Date End Date Taking? Authorizing Provider  cephALEXin (KEFLEX) 500 MG capsule Take 1 capsule (500 mg total) by mouth 4 (four) times daily. 12/27/14   Ilyssa Grennan, PA-C  oxyCODONE-acetaminophen (PERCOCET/ROXICET) 5-325 MG per tablet Take 1 tablet by mouth every 8 (eight) hours as needed for moderate pain or severe pain. 12/27/14   Araina Butrick, PA-C  sulfamethoxazole-trimethoprim (BACTRIM DS,SEPTRA DS)  800-160 MG per tablet Take 1 tablet by mouth 2 (two) times daily. 12/27/14 01/03/15  Eamon Tantillo, PA-C   BP 112/75 mmHg  Pulse 75  Temp(Src) 98.6 F (37 C)  Resp 18  SpO2 100%  LMP 12/10/2014 Physical Exam  Constitutional: She is oriented to person, place, and time. She appears well-developed and well-nourished. No distress.  HENT:  Head: Normocephalic and atraumatic.  Eyes: Conjunctivae and EOM are normal. Right eye exhibits no discharge. Left eye exhibits no discharge.  Neck: Normal range of motion. Neck supple. No tracheal deviation present.  Cardiovascular: Normal rate, regular rhythm and normal heart sounds.  Exam reveals no friction rub.   No murmur heard. Pulmonary/Chest: Effort normal and breath sounds normal. No respiratory distress. She has no wheezes. She has no rales.  Musculoskeletal: Normal range of motion.  Full ROM to upper and lower extremities without difficulty noted, negative ataxia noted.  Lymphadenopathy:    She has no cervical adenopathy.  Neurological: She is alert and oriented to person, place, and time. No cranial nerve deficit. She exhibits normal muscle tone. Coordination normal.  Skin: Skin is warm and dry. She is not diaphoretic. There is erythema.  Approximately 3 cm x 3 cm fluctuant abscess identified to the right axilla with active drainage noted when pressure applied. Purulent drainage noted. Mild erythema surrounding the area. Negative red streaks. Tenderness upon palpation.  Psychiatric: She has a normal mood and affect. Her behavior is normal. Thought content normal.  Nursing note and vitals reviewed.   ED Course  Procedures (including critical  care time)  INCISION AND DRAINAGE Performed by: Jamse Mead Consent: Verbal consent obtained. Risks and benefits: risks, benefits and alternatives were discussed Type: abscess Body area: Right axilla Anesthesia: local infiltration Incision was made with a scalpel. Local anesthetic: lidocaine 1  % without epinephrine Anesthetic total: 3 ml - negative flash with pullback Complexity: complex Blunt dissection to break up loculations Drainage: purulent Drainage amount: copius Patient tolerance: Patient tolerated the procedure well with no immediate complications.    Labs Review Labs Reviewed - No data to display  Imaging Review No results found.   EKG Interpretation None      MDM   Final diagnoses:  Abscess of axilla, right    Medications  lidocaine (PF) (XYLOCAINE) 1 % injection 5 mL (5 mLs Infiltration Given 12/27/14 1148)   Filed Vitals:   12/27/14 1102  BP: 112/75  Pulse: 75  Temp: 98.6 F (37 C)  Resp: 18  SpO2: 100%    Patient presenting to the ED with right axilla abscess. I&D performed in the Ed with copious amounts of purulent drainage - patient tolerated procedure well. Patient stable, afebrile. Patient not septic appearing. Negative signs of respiratory distress. Discharged patient. Discharged patient with antibiotics and pain medications - discussed course, precautions, disposal technique. Discussed with patient to apply warm compressions on the site at least 3-4 times per day. Reported that patient needs to be report back to the ED within 48 hours for site to be re-assessed. Discussed with patient that she may need to see surgery regarding beginnings of a sinus track since that has occurred multiple times to the right axilla - patient understood. Discussed with patient to closely monitor symptoms and if symptoms are to worsen or change to report back to the ED - strict return instructions given.  Patient agreed to plan of care, understood, all questions answered.    Jamse Mead, PA-C 12/27/14 1221  Ephraim Hamburger, MD 12/27/14 [place], PA-C 12/28/14 [place], MD 12/29/14 [phone]

## 2014-12-27 NOTE — ED Notes (Signed)
Pt reports abscess under right arm x 5 days with drainage and pain 8/10. No other c/o

## 2019-12-28 ENCOUNTER — Other Ambulatory Visit: Payer: Self-pay

## 2019-12-28 ENCOUNTER — Emergency Department (HOSPITAL_COMMUNITY)
Admission: EM | Admit: 2019-12-28 | Discharge: 2019-12-28 | Disposition: A | Discharge status: Home / Self Care | Payer: BC Managed Care – PPO | Attending: Emergency Medicine | Admitting: Emergency Medicine

## 2019-12-28 ENCOUNTER — Encounter (HOSPITAL_COMMUNITY): Payer: Self-pay

## 2019-12-28 DIAGNOSIS — R42 Dizziness and giddiness: Secondary | ICD-10-CM | POA: Diagnosis not present

## 2019-12-28 DIAGNOSIS — Z20822 Contact with and (suspected) exposure to covid-19: Secondary | ICD-10-CM | POA: Insufficient documentation

## 2019-12-28 DIAGNOSIS — F17210 Nicotine dependence, cigarettes, uncomplicated: Secondary | ICD-10-CM | POA: Insufficient documentation

## 2019-12-28 DIAGNOSIS — Z79899 Other long term (current) drug therapy: Secondary | ICD-10-CM | POA: Diagnosis not present

## 2019-12-28 DIAGNOSIS — J0110 Acute frontal sinusitis, unspecified: Secondary | ICD-10-CM | POA: Diagnosis not present

## 2019-12-28 LAB — BASIC METABOLIC PANEL
Anion gap: 8 (ref 5–15)
BUN: 14 mg/dL (ref 6–20)
CO2: 23 mmol/L (ref 22–32)
Calcium: 9 mg/dL (ref 8.9–10.3)
Chloride: 106 mmol/L (ref 98–111)
Creatinine, Ser: 0.83 mg/dL (ref 0.44–1.00)
GFR calc Af Amer: >60 mL/min (ref >60)
GFR calc non Af Amer: >60 mL/min (ref >60)
Glucose, Bld: 91 mg/dL (ref 70–99)
Potassium: 3.8 mmol/L (ref 3.5–5.1)
Sodium: 137 mmol/L (ref 135–145)

## 2019-12-28 LAB — TSH: TSH: 1.99 u[IU]/mL (ref 0.350–4.500)

## 2019-12-28 LAB — CBC WITH DIFFERENTIAL/PLATELET
Abs Immature Granulocytes: 0.03 10*3/uL (ref 0.00–0.07)
Basophils Absolute: 0 10*3/uL (ref 0.0–0.1)
Basophils Relative: 0 %
Eosinophils Absolute: 0 10*3/uL (ref 0.0–0.5)
Eosinophils Relative: 1 %
HCT: 36.4 % (ref 36.0–46.0)
Hemoglobin: 11.4 g/dL — ABNORMAL LOW (ref 12.0–15.0)
Immature Granulocytes: 1 %
Lymphocytes Relative: 29 %
Lymphs Abs: 1.7 10*3/uL (ref 0.7–4.0)
MCH: 27.1 pg (ref 26.0–34.0)
MCHC: 31.3 g/dL (ref 30.0–36.0)
MCV: 86.7 fL (ref 80.0–100.0)
Monocytes Absolute: 0.3 10*3/uL (ref 0.1–1.0)
Monocytes Relative: 5 %
Neutro Abs: 3.8 10*3/uL (ref 1.7–7.7)
Neutrophils Relative %: 64 %
Platelets: 120 10*3/uL — ABNORMAL LOW (ref 150–400)
RBC: 4.2 MIL/uL (ref 3.87–5.11)
RDW: 15.3 % (ref 11.5–15.5)
WBC: 5.9 10*3/uL (ref 4.0–10.5)
nRBC: 0 % (ref 0.0–0.2)

## 2019-12-28 LAB — I-STAT BETA HCG BLOOD, ED (MC, WL, AP ONLY): I-stat hCG, quantitative: <5 m[IU]/mL (ref <5)

## 2019-12-28 MED ORDER — KETOROLAC TROMETHAMINE 15 MG/ML IJ SOLN
15.0000 mg | Freq: Once | INTRAMUSCULAR | Status: AC
  Administered 2019-12-28: 18:00:00 15 mg via INTRAVENOUS
  Filled 2019-12-28: qty 1

## 2019-12-28 MED ORDER — AMOXICILLIN-POT CLAVULANATE 875-125 MG PO TABS: 1.0000 | ORAL_TABLET | Freq: Two times a day (BID) | ORAL | 0 refills | Status: AC

## 2019-12-28 MED ORDER — SODIUM CHLORIDE 0.9 % IV BOLUS
1000.0000 mL | Freq: Once | INTRAVENOUS | Status: AC
  Administered 2019-12-28: 18:00:00 1000 mL via INTRAVENOUS

## 2019-12-28 NOTE — ED Provider Notes (Signed)
[facility] DEPT Provider Note   CSN: [id number] Arrival date & time: 12/28/19  1409     History Chief Complaint  Patient presents with  . Dizziness  . Nasal Congestion    Stacy Gray is a 39 y.o. female who presents with dizziness/lightheadedness.  She states that started yesterday when she was at the gas station with her son.  She had an acute onset of dizziness and felt like her heart was racing.  She sat down and her car and took deep breaths and feeling subsided.  This morning she had that sensation again.  Seems to be worse when she is up and moving around or moving her head.  She is having difficulty discerning if it is a spinning sensation versus feeling like she is going to pass out.  She states at times it feels like both.  She reports a pressure in her head and has had some nasal congestion.  She took DayQuil and use vapor rub and the congestion has cleared up.  She continues to have pressure sensation over the frontal part of her head.  She denies loss of consciousness, severe headache, current dizziness, chest pain, shortness of breath, cough.  She is having some pelvic cramping because she just started her period.  She continued to have dizziness she decided to call EMS.  She is worried that she has vertigo she states that her mom has had similar symptoms and states that that is how her vertigo started for her.  HPI     History reviewed. No pertinent past medical history.  There are no problems to display for this patient.   History reviewed. No pertinent surgical history.   OB History   No obstetric history on file.     History reviewed. No pertinent family history.  Social History   Tobacco Use  . Smoking status: Current Every Day Smoker    Types: Cigarettes  . Smokeless tobacco: Never Used  Substance Use Topics  . Alcohol use: No  . Drug use: Not on file    Home Medications Prior to Admission medications   Medication Sig  Start Date End Date Taking? Authorizing Provider  cephALEXin (KEFLEX) 500 MG capsule Take 1 capsule (500 mg total) by mouth 4 (four) times daily. 12/27/14   Sciacca, Marissa, PA-C  oxyCODONE-acetaminophen (PERCOCET/ROXICET) 5-325 MG per tablet Take 1 tablet by mouth every 8 (eight) hours as needed for moderate pain or severe pain. 12/27/14   Sciacca, Mable Fill, PA-C    Allergies    Patient has no known allergies.  Review of Systems   Review of Systems  Constitutional: Negative for chills, diaphoresis and fever.  HENT: Positive for congestion.   Respiratory: Negative for cough and shortness of breath.   Cardiovascular: Negative for chest pain.  Gastrointestinal: Negative for abdominal pain.  Genitourinary: Positive for vaginal bleeding.  Musculoskeletal: Negative for myalgias.  Neurological: Positive for dizziness, light-headedness and headaches. Negative for syncope.    Physical Exam Updated Vital Signs BP 119/83   Pulse 66   Temp 97.9 F (36.6 C)   Resp 16   SpO2 100%   Physical Exam Vitals and nursing note reviewed.  Constitutional:      General: She is not in acute distress.    Appearance: Normal appearance. She is well-developed. She is not ill-appearing.     Comments: Cooperative.  Mildly anxious about her symptoms. On phone during part of exam  HENT:     Head: Normocephalic and  atraumatic.     Right Ear: Tympanic membrane normal.     Left Ear: Tympanic membrane normal.     Nose: Nose normal.     Right Turbinates: Swollen.     Left Turbinates: Swollen.  Eyes:     General: No scleral icterus.       Right eye: No discharge.        Left eye: No discharge.     Conjunctiva/sclera: Conjunctivae normal.     Pupils: Pupils are equal, round, and reactive to light.  Cardiovascular:     Rate and Rhythm: Normal rate and regular rhythm.  Pulmonary:     Effort: Pulmonary effort is normal. No respiratory distress.     Breath sounds: Normal breath sounds.  Abdominal:      General: There is no distension.  Musculoskeletal:     Cervical back: Normal range of motion.  Skin:    General: Skin is warm and dry.  Neurological:     Mental Status: She is alert and oriented to person, place, and time.     Comments: Lying on stretcher in NAD. GCS 15. Speaks in a clear voice. Cranial nerves II through XII grossly intact. 5/5 strength in all extremities. Sensation fully intact.  Bilateral finger-to-nose intact. Ambulatory   Psychiatric:        Behavior: Behavior normal.     ED Results / Procedures / Treatments   Labs (all labs ordered are listed, but only abnormal results are displayed) Labs Reviewed  CBC WITH DIFFERENTIAL/PLATELET - Abnormal; Notable for the following components:      Result Value   Hemoglobin 11.4 (*)    Platelets 120 (*)    All other components within normal limits  NOVEL CORONAVIRUS, NAA (HOSP ORDER, SEND-OUT TO REF LAB; TAT 18-24 HRS)  BASIC METABOLIC PANEL  TSH  I-STAT BETA HCG BLOOD, ED ([facility], [facility], [facility] ONLY)    EKG EKG Interpretation  Date/Time:  Friday December 28 2019 14:54:00 EST Ventricular Rate:  62 PR Interval:    QRS Duration: 100 QT Interval:  418 QTC Calculation: 425 R Axis:   32 Text Interpretation: Sinus rhythm Borderline T abnormalities, anterior leads No old tracing to compare Confirmed by Deno Etienne [phone]) on 12/29/2019 1:52:58 PM   Radiology No results found.  Procedures Procedures (including critical care time)  Medications Ordered in ED Medications  sodium chloride 0.9 % bolus 1,000 mL (0 mLs Intravenous Stopped 12/28/19 2004)  ketorolac (TORADOL) 15 MG/ML injection 15 mg (15 mg Intravenous Given 12/28/19 1734)    ED Course  I have reviewed the triage vital signs and the nursing notes.  Pertinent labs & imaging results that were available during my care of the patient were reviewed by me and considered in my medical decision making (see chart for details).  39 year old female presents with vague  dizziness/lightheadedness and frontal head pressure since last night.  Symptoms have been waxing and waning in nature.  Patient is anxious on exam.  She has a normal neurologic exam.  She does have swollen turbinates on ENT exam.  EKG is sinus rhythm.  Labs are overall reassuring.  Orthostatics were borderline positive.  Will give liter of fluids and Toradol  Patient feels a little bit better after medications.  At this time I do not think she needs any imaging.  We will treat for a possible sinus infection with Flonase and Augmentin.  Advised return if worsening.  MDM Rules/Calculators/A&P  Final Clinical Impression(s) / ED Diagnoses Final diagnoses:  Dizziness  Acute frontal sinusitis, recurrence not specified    Rx / DC Orders ED Discharge Orders    None       Recardo Evangelist, PA-C 12/29/19 [place], DO 01/02/20 1505

## 2019-12-28 NOTE — ED Triage Notes (Signed)
Patient from home, c/o dizziness and sinus congestion since yesterday. Pt used vapor rub last night and dayquil this morning. EMS reports patient is very anxious. Lung sounds clear. Family hx of vertigo  BP 168/120 p 70 100% RA 98 CBG T 98.1

## 2019-12-28 NOTE — Discharge Instructions (Addendum)
Use Flonase nasal spray for nasal congestion and sinus pressure Take Augmentin twice daily for 5 days for sinus infection Please drink plenty of fluids Return if worsening

## 2019-12-29 LAB — NOVEL CORONAVIRUS, NAA (HOSP ORDER, SEND-OUT TO REF LAB; TAT 18-24 HRS): SARS-CoV-2, NAA: NOT DETECTED

## 2020-01-16 LAB — HM HIV SCREENING LAB: HM HIV Screening: NEGATIVE

## 2020-01-16 LAB — HM PAP SMEAR

## 2020-09-06 ENCOUNTER — Emergency Department
Admission: EM | Admit: 2020-09-06 | Discharge: 2020-09-06 | Disposition: A | Discharge status: Home / Self Care | Payer: BC Managed Care – PPO | Attending: Emergency Medicine | Admitting: Emergency Medicine

## 2020-09-06 ENCOUNTER — Encounter: Payer: Self-pay | Admitting: Emergency Medicine

## 2020-09-06 ENCOUNTER — Other Ambulatory Visit: Payer: Self-pay

## 2020-09-06 ENCOUNTER — Emergency Department: Payer: BC Managed Care – PPO

## 2020-09-06 DIAGNOSIS — H53149 Visual discomfort, unspecified: Secondary | ICD-10-CM | POA: Insufficient documentation

## 2020-09-06 DIAGNOSIS — R202 Paresthesia of skin: Secondary | ICD-10-CM | POA: Insufficient documentation

## 2020-09-06 DIAGNOSIS — R519 Headache, unspecified: Secondary | ICD-10-CM | POA: Insufficient documentation

## 2020-09-06 DIAGNOSIS — R110 Nausea: Secondary | ICD-10-CM | POA: Insufficient documentation

## 2020-09-06 DIAGNOSIS — R0602 Shortness of breath: Secondary | ICD-10-CM | POA: Insufficient documentation

## 2020-09-06 DIAGNOSIS — R079 Chest pain, unspecified: Secondary | ICD-10-CM | POA: Insufficient documentation

## 2020-09-06 DIAGNOSIS — Z5321 Procedure and treatment not carried out due to patient leaving prior to being seen by health care provider: Secondary | ICD-10-CM | POA: Insufficient documentation

## 2020-09-06 DIAGNOSIS — R059 Cough, unspecified: Secondary | ICD-10-CM | POA: Insufficient documentation

## 2020-09-06 LAB — CBC
HCT: 32.4 % — ABNORMAL LOW (ref 36.0–46.0)
Hemoglobin: 10.6 g/dL — ABNORMAL LOW (ref 12.0–15.0)
MCH: 27 pg (ref 26.0–34.0)
MCHC: 32.7 g/dL (ref 30.0–36.0)
MCV: 82.7 fL (ref 80.0–100.0)
Platelets: 142 10*3/uL — ABNORMAL LOW (ref 150–400)
RBC: 3.92 MIL/uL (ref 3.87–5.11)
RDW: 15 % (ref 11.5–15.5)
WBC: 3.1 10*3/uL — ABNORMAL LOW (ref 4.0–10.5)
nRBC: 0 % (ref 0.0–0.2)

## 2020-09-06 LAB — COMPREHENSIVE METABOLIC PANEL
ALT: 14 U/L (ref 0–44)
AST: 27 U/L (ref 15–41)
Albumin: 3.7 g/dL (ref 3.5–5.0)
Alkaline Phosphatase: 59 U/L (ref 38–126)
Anion gap: 8 (ref 5–15)
BUN: 12 mg/dL (ref 6–20)
CO2: 24 mmol/L (ref 22–32)
Calcium: 8.7 mg/dL — ABNORMAL LOW (ref 8.9–10.3)
Chloride: 106 mmol/L (ref 98–111)
Creatinine, Ser: 0.84 mg/dL (ref 0.44–1.00)
GFR calc Af Amer: >60 mL/min (ref >60)
GFR calc non Af Amer: >60 mL/min (ref >60)
Glucose, Bld: 127 mg/dL — ABNORMAL HIGH (ref 70–99)
Potassium: 3.5 mmol/L (ref 3.5–5.1)
Sodium: 138 mmol/L (ref 135–145)
Total Bilirubin: 0.6 mg/dL (ref 0.3–1.2)
Total Protein: 7.5 g/dL (ref 6.5–8.1)

## 2020-09-06 LAB — TROPONIN I (HIGH SENSITIVITY)
Troponin I (High Sensitivity): 3 ng/L (ref <18)
Troponin I (High Sensitivity): 3 ng/L (ref <18)

## 2020-09-06 NOTE — ED Triage Notes (Signed)
Pt here for headache since yesterday. No hx of migraine or headaches. + nausea and photophobia, also loud noises make headache worse.  Pt appears anxious and was hyperventilating on arrival.  C/o tingling all over; encouraged to slow breathing.  Reports feeling like having pain in chest and cannot breathe.  Has had a light cough per pt.  No known fever.  VSS in triage.

## 2020-09-15 ENCOUNTER — Other Ambulatory Visit: Payer: Self-pay | Admitting: Nurse Practitioner

## 2020-09-15 DIAGNOSIS — U071 COVID-19: Secondary | ICD-10-CM

## 2020-09-15 NOTE — Progress Notes (Signed)
I connected by phone with Stacy Gray on 09/15/2020 at 4:03 PM to discuss the potential use of a new treatment for mild to moderate COVID-19 viral infection in non-hospitalized patients.  This patient is a 39 y.o. female that meets the FDA criteria for Emergency Use Authorization of COVID monoclonal antibody casirivimab/imdevimab or bamlanivimab/eteseviamb.  Has a (+) direct SARS-CoV-2 viral test result  Has mild or moderate COVID-19   Is NOT hospitalized due to COVID-19  Is within 10 days of symptom onset  Has at least one of the high risk factor(s) for progression to severe COVID-19 and/or hospitalization as defined in EUA.  Specific high risk criteria : BMI > 25   I have spoken and communicated the following to the patient or parent/caregiver regarding COVID monoclonal antibody treatment:  1. FDA has authorized the emergency use for the treatment of mild to moderate COVID-19 in adults and pediatric patients with positive results of direct SARS-CoV-2 viral testing who are 42 years of age and older weighing at least 40 kg, and who are at high risk for progressing to severe COVID-19 and/or hospitalization.  2. The significant known and potential risks and benefits of COVID monoclonal antibody, and the extent to which such potential risks and benefits are unknown.  3. Information on available alternative treatments and the risks and benefits of those alternatives, including clinical trials.  4. Patients treated with COVID monoclonal antibody should continue to self-isolate and use infection control measures (e.g., wear mask, isolate, social distance, avoid sharing personal items, clean and disinfect "high touch" surfaces, and frequent handwashing) according to CDC guidelines.   5. The patient or parent/caregiver has the option to accept or refuse COVID monoclonal antibody treatment.  After reviewing this information with the patient, the patient has agreed to receive one of the available  covid 19 monoclonal antibodies and will be provided an appropriate fact sheet prior to infusion. Jobe Gibbon, NP 09/15/2020 4:03 PM

## 2020-09-16 ENCOUNTER — Ambulatory Visit (HOSPITAL_COMMUNITY)
Admission: RE | Admit: 2020-09-16 | Discharge: 2020-09-16 | Disposition: A | Discharge status: Home / Self Care | Payer: BC Managed Care – PPO | Source: Ambulatory Visit | Attending: Pulmonary Disease | Admitting: Pulmonary Disease

## 2020-09-16 ENCOUNTER — Ambulatory Visit (HOSPITAL_COMMUNITY): Payer: BC Managed Care – PPO

## 2020-09-16 DIAGNOSIS — U071 COVID-19: Secondary | ICD-10-CM | POA: Diagnosis present

## 2020-09-16 MED ORDER — EPINEPHRINE 0.3 MG/0.3ML IJ SOAJ: 0.3000 mg | Freq: Once | INTRAMUSCULAR | Status: DC | PRN

## 2020-09-16 MED ORDER — ALBUTEROL SULFATE HFA 108 (90 BASE) MCG/ACT IN AERS: 2.0000 | INHALATION_SPRAY | Freq: Once | RESPIRATORY_TRACT | Status: DC | PRN

## 2020-09-16 MED ORDER — SODIUM CHLORIDE 0.9 % IV SOLN: INTRAVENOUS | Status: DC | PRN

## 2020-09-16 MED ORDER — SODIUM CHLORIDE 0.9 % IV SOLN
Freq: Once | INTRAVENOUS | Status: AC
  Filled 2020-09-16: qty 20

## 2020-09-16 MED ORDER — DIPHENHYDRAMINE HCL 50 MG/ML IJ SOLN: 50.0000 mg | Freq: Once | INTRAMUSCULAR | Status: DC | PRN

## 2020-09-16 MED ORDER — METHYLPREDNISOLONE SODIUM SUCC 125 MG IJ SOLR: 125.0000 mg | Freq: Once | INTRAMUSCULAR | Status: DC | PRN

## 2020-09-16 MED ORDER — FAMOTIDINE IN NACL 20-0.9 MG/50ML-% IV SOLN: 20.0000 mg | Freq: Once | INTRAVENOUS | Status: DC | PRN

## 2020-09-16 NOTE — Discharge Instructions (Signed)

## 2020-09-16 NOTE — Progress Notes (Signed)
  Diagnosis: COVID-19  Physician: Dr. Asencion Noble Procedure: [facility] Med: bamlanivimab\etesevimab infusion - Provided patient with bamlanimivab\etesevimab fact sheet for patients, parents and caregivers prior to infusion.  Complications: No immediate complications noted.  Discharge: Discharged home   Gaye Alken 09/16/2020

## 2020-11-05 ENCOUNTER — Encounter: Payer: Self-pay | Admitting: Infectious Diseases

## 2020-12-15 ENCOUNTER — Encounter (HOSPITAL_COMMUNITY): Payer: Self-pay

## 2020-12-15 ENCOUNTER — Other Ambulatory Visit: Payer: Self-pay

## 2020-12-15 DIAGNOSIS — R030 Elevated blood-pressure reading, without diagnosis of hypertension: Secondary | ICD-10-CM | POA: Diagnosis present

## 2020-12-15 DIAGNOSIS — F17210 Nicotine dependence, cigarettes, uncomplicated: Secondary | ICD-10-CM | POA: Insufficient documentation

## 2020-12-15 NOTE — ED Triage Notes (Addendum)
Pt BIB [facility]. Pt had first [place] and flu vaccine at same time today. Pt hypertension. Pt BP 210/100. [facility] states pt is anxious. Pt wishes to be checked out. Pt denies headache, dizziness. Pt BP 161/95 in triage.

## 2020-12-15 NOTE — ED Notes (Signed)
Pt requested her vital signs to be reassessed. If her BP is WNL, she will LWBS.

## 2020-12-16 ENCOUNTER — Emergency Department (HOSPITAL_COMMUNITY)
Admission: EM | Admit: 2020-12-16 | Discharge: 2020-12-16 | Disposition: A | Discharge status: Home / Self Care | Payer: BC Managed Care – PPO | Attending: Emergency Medicine | Admitting: Emergency Medicine

## 2020-12-16 DIAGNOSIS — R030 Elevated blood-pressure reading, without diagnosis of hypertension: Secondary | ICD-10-CM

## 2020-12-16 MED ORDER — ACETAMINOPHEN 500 MG PO TABS
1000.0000 mg | ORAL_TABLET | Freq: Once | ORAL | Status: AC
  Administered 2020-12-16: 1000 mg via ORAL
  Filled 2020-12-16: qty 2

## 2020-12-16 NOTE — Discharge Instructions (Signed)
Keep an eye on your symptoms.  May developed fever after vaccine which can be normal.  Can take tylenol as needed for pain in left arm and/or fever. Follow-up with your doctor on Friday for re-check of your blood pressure.

## 2020-12-16 NOTE — ED Provider Notes (Signed)
[facility] DEPT Provider Note   CSN: [id number] Arrival date & time: 12/15/20  1830     History Chief Complaint  Patient presents with  . Hypertension    Stacy Gray is a 40 y.o. female.  The history is provided by the patient and medical records.  Hypertension    40 year old female with no significant past medical history presenting to the ED for elevated blood pressure reading.  Patient states she had her first COVID-vaccine Therapist, music) and flu vaccine today at the pharmacy, both in her left arm.  States after shots she was driving home and felt "cold chills" all of sudden and started to panic followed by full blown anxiety attack.  She pulled over and called EMS, initial reading was 210/100.  Patient states she just wanted to be checked out.  She denies any headache, dizziness, chest pain, or shortness of breath.  By time of arrival in ED BP 161/95.  She denies any history of hypertension but it does run in the family.  She is having a lot of pain in her left arm from her vaccines as both were placed in the same arm.  She has not had any Tylenol or Motrin for pain.  She did call and schedule appointment with PCP for Friday for re-check of BP.  History reviewed. No pertinent past medical history.  There are no problems to display for this patient.   History reviewed. No pertinent surgical history.   OB History   No obstetric history on file.     History reviewed. No pertinent family history.  Social History   Tobacco Use  . Smoking status: Current Every Day Smoker    Types: Cigarettes  . Smokeless tobacco: Never Used  Substance Use Topics  . Alcohol use: No    Home Medications Prior to Admission medications   Medication Sig Start Date End Date Taking? Authorizing Provider  amoxicillin-clavulanate (AUGMENTIN) 875-125 MG tablet Take 1 tablet by mouth every 12 (twelve) hours. 12/28/19   Recardo Evangelist, PA-C  Black Currant Seed Oil 500  MG CAPS Take 500 mg by mouth daily.    [provider]  Multiple Vitamin (MULTIVITAMIN) tablet Take 1 tablet by mouth daily.    [provider]    Allergies    Patient has no known allergies.  Review of Systems   Review of Systems  Constitutional:       Elevated BP  All other systems reviewed and are negative.   Physical Exam Updated Vital Signs BP (!) 171/97   Pulse 67   Temp 98.5 F (36.9 C) (Oral)   Resp 18   SpO2 99%   Physical Exam Vitals and nursing note reviewed.  Constitutional:      Appearance: She is well-developed and well-nourished.     Comments: Sleeping, awoken for exam  HENT:     Head: Normocephalic and atraumatic.     Mouth/Throat:     Mouth: Oropharynx is clear and moist.  Eyes:     Extraocular Movements: EOM normal.     Conjunctiva/sclera: Conjunctivae normal.     Pupils: Pupils are equal, round, and reactive to light.  Cardiovascular:     Rate and Rhythm: Normal rate and regular rhythm.     Heart sounds: Normal heart sounds.  Pulmonary:     Effort: Pulmonary effort is normal.     Breath sounds: Normal breath sounds.  Abdominal:     General: Bowel sounds are normal.  Palpations: Abdomen is soft.  Musculoskeletal:        General: Normal range of motion.     Cervical back: Normal range of motion.     Comments: Bandages in place to left upper deltoid, no swelling or surrounding lymphadenopathy noted  Skin:    General: Skin is warm and dry.  Neurological:     Mental Status: She is alert and oriented to person, place, and time.     Comments: AAOx3, answering questions and following commands appropriately; equal strength UE and LE bilaterally; CN grossly intact; moves all extremities appropriately without ataxia; no focal neuro deficits or facial asymmetry appreciated  Psychiatric:        Mood and Affect: Mood and affect normal.     Comments: Calm, cooperative, does seem a bit anxious when BP is taken     ED Results /  Procedures / Treatments   Labs (all labs ordered are listed, but only abnormal results are displayed) Labs Reviewed - No data to display  EKG None  Radiology No results found.  Procedures Procedures (including critical care time)  Medications Ordered in ED Medications - No data to display  ED Course  I have reviewed the triage vital signs and the nursing notes.  Pertinent labs & imaging results that were available during my care of the patient were reviewed by me and considered in my medical decision making (see chart for details).    MDM Rules/Calculators/A&P  40 year old female presenting to the ED with elevated blood pressure readings today after receiving first COVID vaccine and flu vaccine in same arm.  States afterwards she got "cold chills" and then had a panic attack.  Per EMS, seem to have worsening anxiety the more her blood pressure was taken.  This was observed in the ED by RN as well.  She is afebrile and nontoxic in appearance here.  Does have some tenderness of the left upper arm at area of injection but no noted swelling or lymphadenopathy.  She denies any chest pain, shortness of breath, headache, dizziness, or history of hypertension in the past.  States she does feel like she got very anxious today, but is also having a lot of pain in her left arm from the vaccines themselves.  Both of these may be contributing to elevated BP readings.  Without any other associated symptoms, I feel acute cardiac or neurologic complications are very unlikely.  No findings to suggest end organ damage at this time.  She has scheduled follow-up with her PCP on Friday for re-check of BP which seems reasonable.  She has taken the rest of the week off, encouraged to rest, relax, stay hydrated.  Return here if any new/acute changes develop.  Final Clinical Impression(s) / ED Diagnoses Final diagnoses:  Elevated blood pressure reading    Rx / DC Orders ED Discharge Orders    None        Larene Pickett, PA-C 12/16/20 [place], [place], DO 12/16/20 0559

## 2022-02-16 ENCOUNTER — Other Ambulatory Visit: Payer: Self-pay

## 2022-02-17 ENCOUNTER — Ambulatory Visit (INDEPENDENT_AMBULATORY_CARE_PROVIDER_SITE_OTHER): Payer: BC Managed Care – PPO

## 2022-02-17 ENCOUNTER — Other Ambulatory Visit: Payer: Self-pay

## 2022-02-17 VITALS — BP 138/79 | HR 73 | Ht 68.0 in | Wt 294.0 lb

## 2022-02-17 DIAGNOSIS — N939 Abnormal uterine and vaginal bleeding, unspecified: Secondary | ICD-10-CM

## 2022-02-17 DIAGNOSIS — Z0000 Encounter for general adult medical examination without abnormal findings: Secondary | ICD-10-CM

## 2022-02-17 NOTE — Progress Notes (Signed)
41 y.o New GYN presents for heavy periods, pain 9-10/10, fibroids, sometimes she bleeds for an entire month x 1 year  ?

## 2022-02-17 NOTE — Progress Notes (Signed)
? ?  Subjective:  ?  ? Stacy Gray is a 41 y.o. female here at [facility] for a routine exam.  Current complaints: patient reports heavy, painful periods for the past year. Periods last 9 days with spotting throughout the entire month. She reports a history of a fibroid that was diagnosed 3 years ago. She has not had any issues up until now. She has not seen an OBGYN to follow up. She is not bleeding today. She has no other concerning symptoms. She is not currently on any form of contraception as she has had a BTL. She would like to discuss options to manage fibroids.   ? ?Do you have a primary care provider? yes ? ?[place] Office Visit from 02/17/2022 in [place]  ?PHQ-2 Total Score 0  ? ?  ? ? ?Health Maintenance Due  ?Topic Date Due  ? Hepatitis C Screening  Never done  ? COVID-19 Vaccine (3 - Booster for Pfizer series) 03/02/2021  ? INFLUENZA VACCINE  07/06/2021  ? MAMMOGRAM  02/13/2022  ?  ?Risk factors for chronic health problems: ?Smoking: no ?Alchohol/how much: no ?Illicit drug use: no ?Exercise: 3x/ week, cardio and weight lifting ?Pt BMI: Body mass index is 44.7 kg/m?. ?  ?Gynecologic History ?Patient's last menstrual period was 02/08/2022 (exact date). ?Contraception: tubal ligation ?Sexual health: monogamous with 1 female partner (husband)- no issues ?Last Pap: 01/16/2020. Results were: normal ?Last mammogram: 02/13/2021. Results were: normal ? ?Obstetric History ?OB History  ?No obstetric history on file.  ? ?The following portions of the patient's history were reviewed and updated as appropriate: allergies, current medications, past family history, past medical history, past social history, past surgical history, and problem list. ? ?Review of Systems ?Pertinent items are noted in HPI.  ?  ?Objective:  ? ?BP 138/79   Pulse 73   Ht '5\' 8"'$  (1.727 m)   Wt 294 lb (133.4 kg)   LMP 02/08/2022 (Exact Date)   BMI 44.70 kg/m?  ?VS reviewed, nursing note reviewed,   ?Constitutional: well developed, well nourished, no distress ?HEENT: normocephalic ?CV: normal rate ?Pulm/chest wall: normal effort ?Breast Exam: Performed: right breast normal without mass, skin or nipple changes or axillary nodes, left breast normal without mass, skin or nipple changes or axillary nodes ?Abdomen: soft ?Neuro: alert and oriented x 3 ?Skin: warm, dry ?Psych: affect normal ?Pelvic exam: Deferred ? ?Assessment/Plan:  ?1. Well woman exam without gynecological exam ?- Pap up to date. Declines STI testing ?- Mammogram ordered ? ?- MM 3D SCREEN BREAST BILATERAL; Future ? ?2. Abnormal vaginal bleeding ?- No ultrasound records in Epic. Will order formal ultrasound ?- Patient would like to avoid hysterectomy is possible. Discussed options of managing AUB, including use of NSAIDs, diet/exercise, and contraception options, including IUD, COCs, Nexplanon, and Depo. Patient wishes to review options and discuss at follow up visit. ? ? ?- US PELVIC COMPLETE WITH TRANSVAGINAL; Future ? ? ?Follow up in: 6 weeks or as needed.  ? ? ?Renee Harder, CNM ?02/17/22 ?9:35 AM  ? ?

## 2022-02-24 ENCOUNTER — Ambulatory Visit
Admission: RE | Admit: 2022-02-24 | Discharge: 2022-02-24 | Disposition: A | Discharge status: Home / Self Care | Payer: BC Managed Care – PPO | Source: Ambulatory Visit

## 2022-02-24 ENCOUNTER — Other Ambulatory Visit: Payer: Self-pay

## 2022-02-24 DIAGNOSIS — N939 Abnormal uterine and vaginal bleeding, unspecified: Secondary | ICD-10-CM | POA: Insufficient documentation

## 2022-03-31 ENCOUNTER — Ambulatory Visit: Payer: BC Managed Care – PPO | Admitting: Obstetrics and Gynecology

## 2022-11-23 IMAGING — US US PELVIS COMPLETE WITH TRANSVAGINAL
1 series · 15 of 25 positions shown · non-contrast
Comparison: Pelvic ultrasound dated 03/13/2008.

CLINICAL DATA: Abnormal uterine bleeding.

EXAM:
TRANSABDOMINAL AND TRANSVAGINAL ULTRASOUND OF PELVIS
TECHNIQUE: Both transabdominal and transvaginal ultrasound examinations of the
pelvis were performed. Transabdominal technique was performed for
global imaging of the pelvis including uterus, ovaries, adnexal
regions, and pelvic cul-de-sac. It was necessary to proceed with
endovaginal exam following the transabdominal exam to visualize the
endometrium and the ovaries.

[Series 1: us pelvis complete with transvaginal · 15 of 71 slices shown]
[im 1/71]
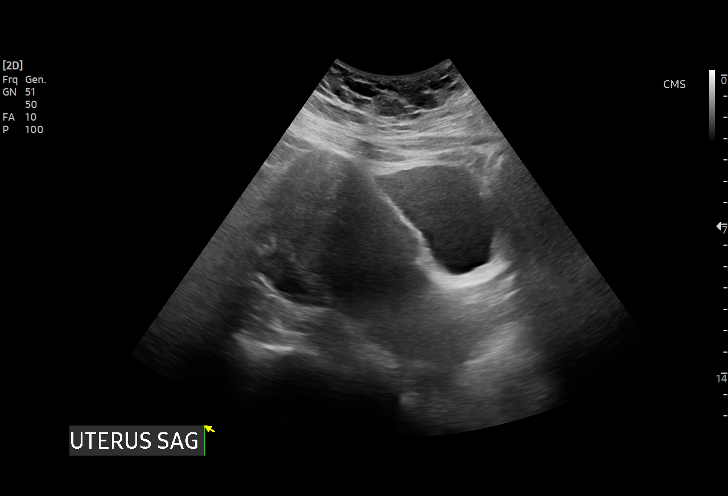
[im 6/71]
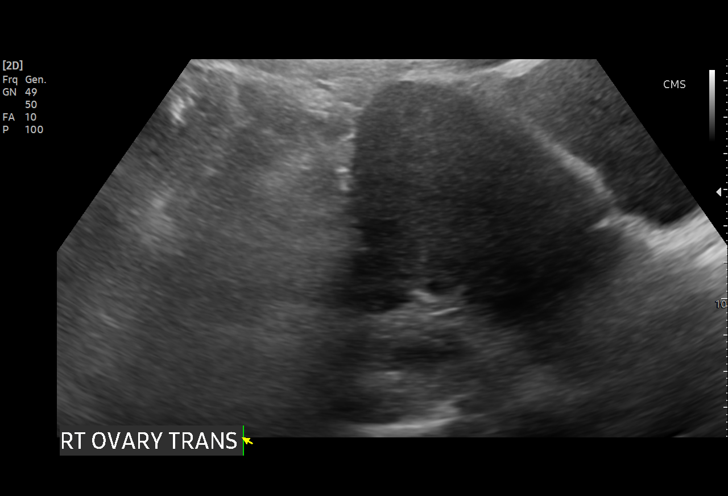
[im 12/71]
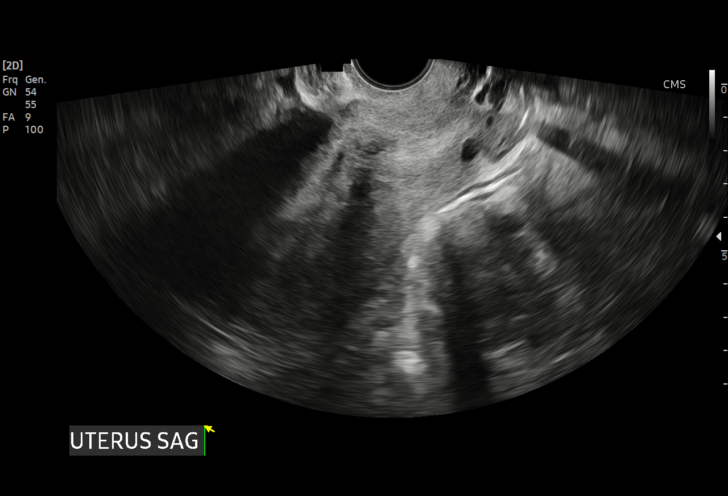
[im 15/71]
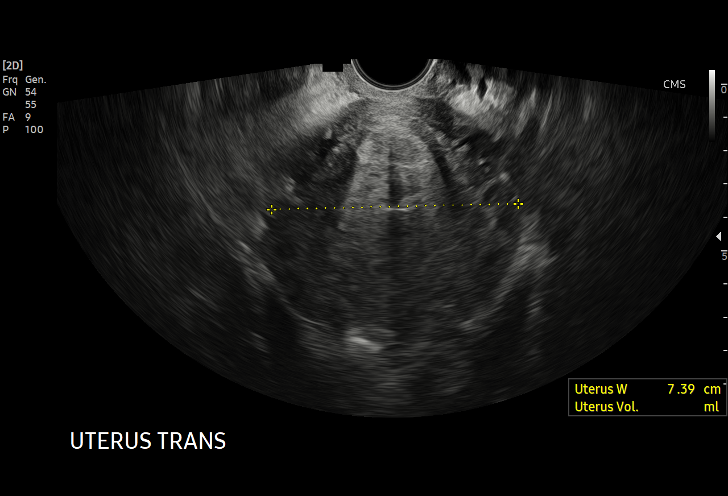
[im 21/71]
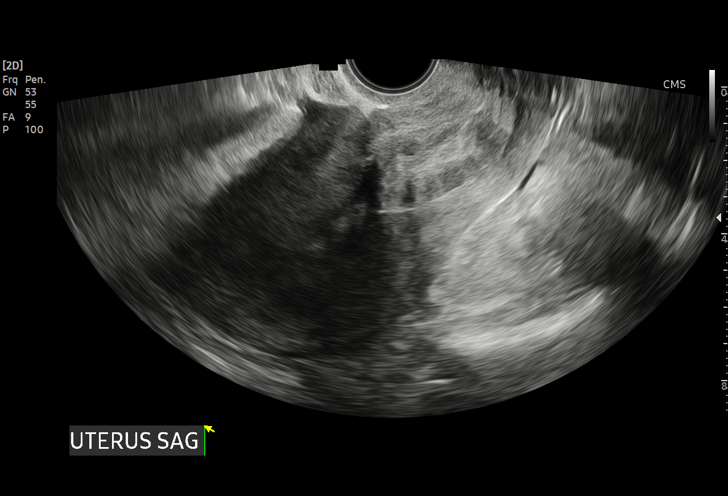
[im 27/71]
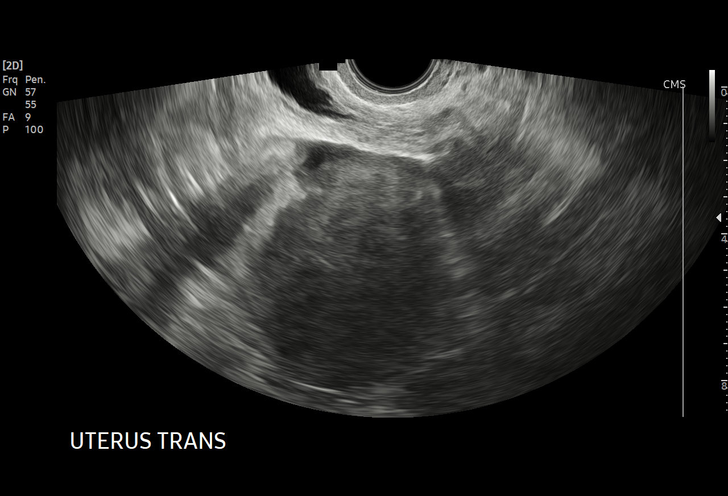
[im 30/71]
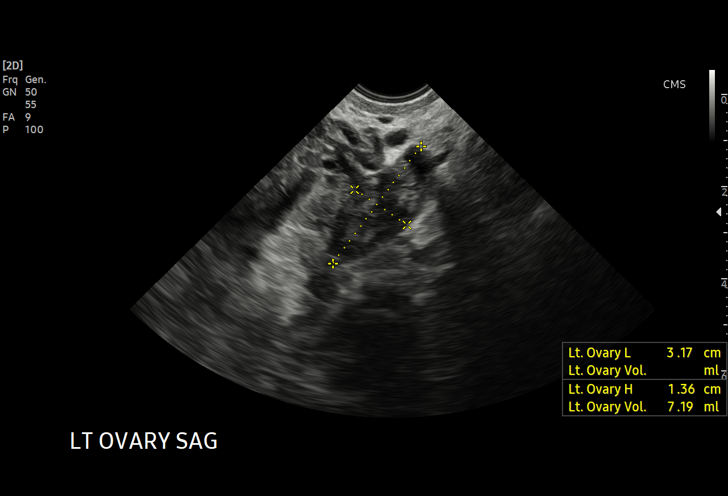
[im 36/71]
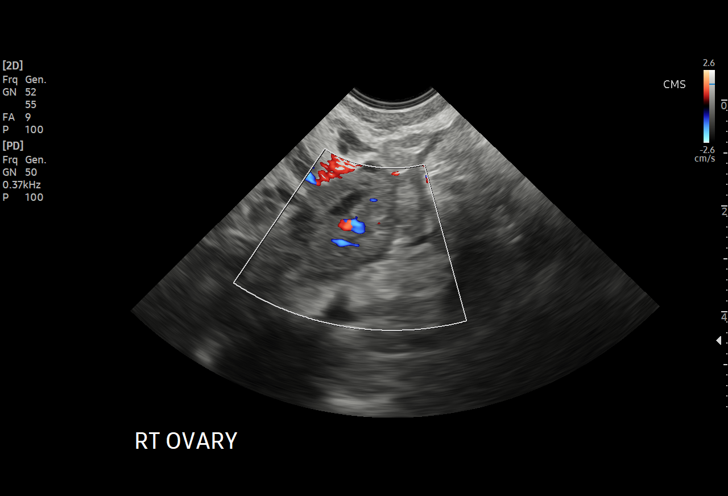
[im 41/71]
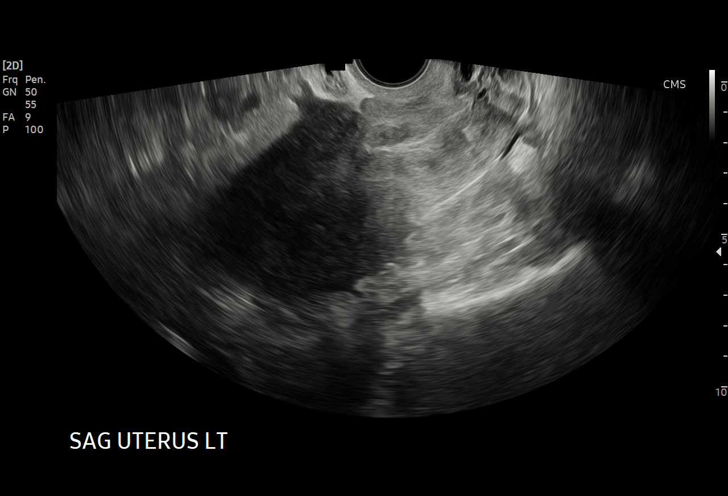
[im 44/71]
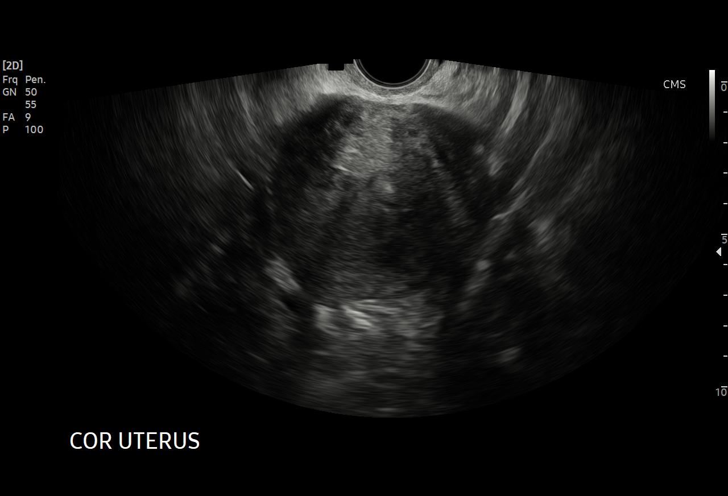
[im 50/71]
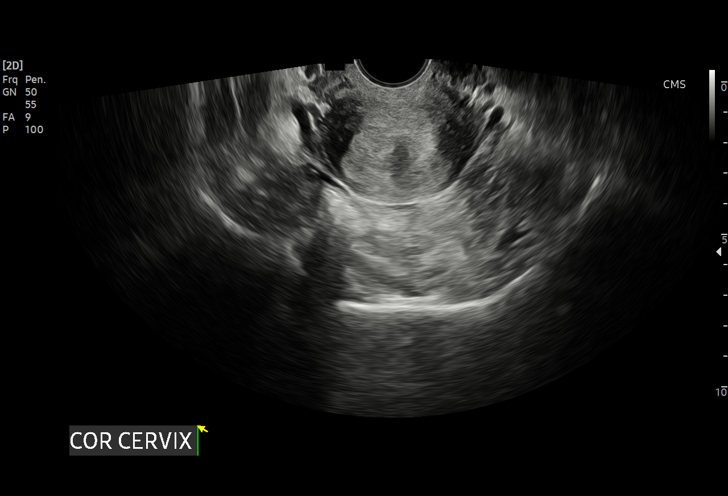
[im 56/71]
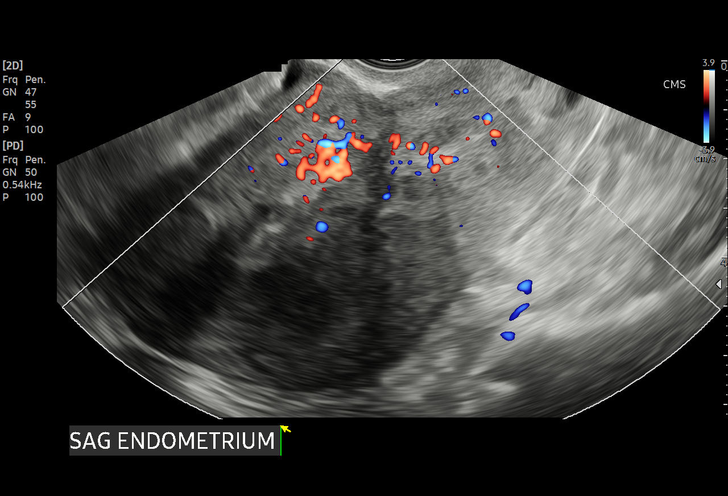
[im 59/71]
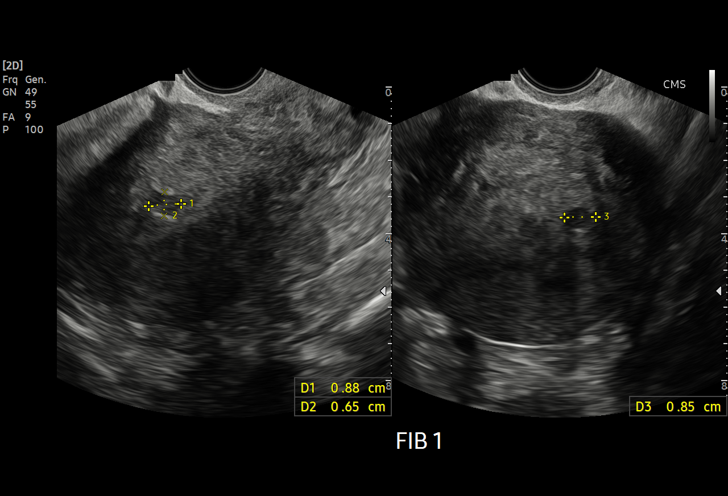
[im 65/71]
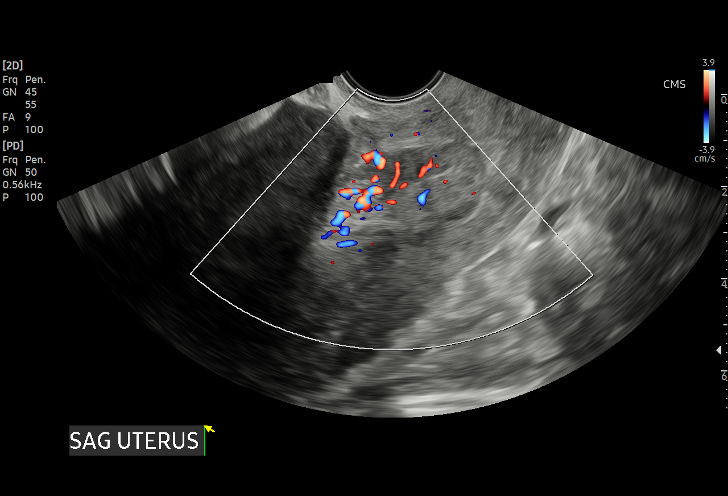
[im 71/71]
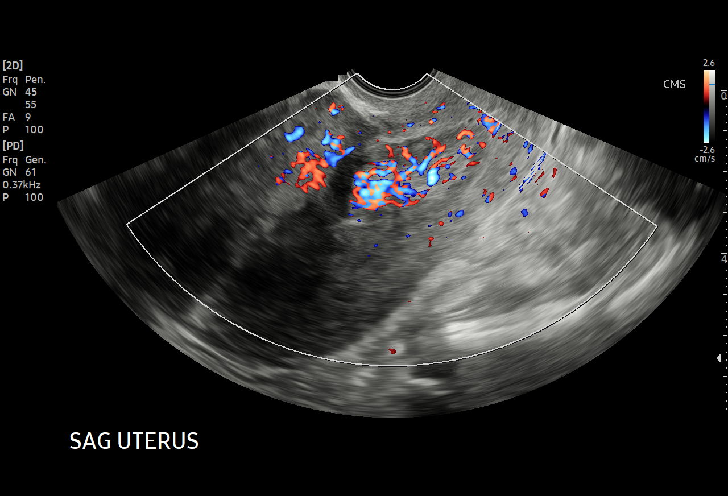

[15 of 25 positions shown; findings below may reference images not displayed]

FINDINGS: Uterus

Measurements: 12.3 x 5.2 x 5.9 cm = volume: 186 mL. The uterus is
anteverted and demonstrates a heterogeneous echotexture. There are
multiple fibroids including a 0.9 x 0.9 x 0.7 cm anterior upper body
submucosal fibroid, a 1.7 x 1.7 x 1.2 cm fundal submucosal fibroid,
and a 2.3 x 2.3 x 1.8 cm right posterior body fibroid. There is a
4.1 x 2.2 x 2.2 cm heterogeneous solid lesion with increased
vascularity in the lower endometrium which may represent a
submucosal or intracavitary fibroid and less likely a polyp. Further
evaluation with hysteroscopy is recommended.

Endometrium

Thickness: 7 mm. The endometrium is poorly visualized and
suboptimally evaluated.

Right ovary

Measurements: 3.3 x 1.8 x 2.1 cm = volume: 6.5 mL. Normal
appearance/no adnexal mass.

Left ovary

Measurements: 3.2 x 1.4 x 2.3 cm = volume: 5.2 mL. Normal
appearance/no adnexal mass.

Other findings

No abnormal free fluid.
IMPRESSION: 1. Heterogeneous uterus with multiple fibroids some of which are
submucosal.
2. Hypervascular lower uterus submucosal fibroid versus polyp.
Further evaluation with hysteroscopy is recommended.
3. Unremarkable ovaries.

## 2023-06-27 ENCOUNTER — Encounter: Payer: BC Managed Care – PPO | Admitting: Obstetrics and Gynecology
